# Patient Record
Sex: Male | Born: 1965 | Race: White | Hispanic: No | Smoking: Former smoker
Health system: Southern US, Community
[De-identification: ages and names within clinical notes are randomized; demographics above are authoritative.]

## PROBLEM LIST (undated history)

## (undated) DIAGNOSIS — D179 Benign lipomatous neoplasm, unspecified: Secondary | ICD-10-CM

## (undated) DIAGNOSIS — F419 Anxiety disorder, unspecified: Secondary | ICD-10-CM

## (undated) DIAGNOSIS — K219 Gastro-esophageal reflux disease without esophagitis: Secondary | ICD-10-CM

## (undated) DIAGNOSIS — I10 Essential (primary) hypertension: Secondary | ICD-10-CM

## (undated) HISTORY — PX: APPENDECTOMY: SHX54

## (undated) HISTORY — PX: CHOLECYSTECTOMY: SHX55

---

## 2001-08-21 HISTORY — PX: STRABISMUS SURGERY: SHX218

## 2008-08-21 HISTORY — PX: WISDOM TOOTH EXTRACTION: SHX21

## 2016-03-13 DIAGNOSIS — I10 Essential (primary) hypertension: Secondary | ICD-10-CM | POA: Diagnosis not present

## 2016-08-01 ENCOUNTER — Inpatient Hospital Stay (HOSPITAL_COMMUNITY)
Admission: EM | Admit: 2016-08-01 | Discharge: 2016-08-04 | DRG: 339 | Disposition: A | Discharge status: Home / Self Care | Payer: BLUE CROSS/BLUE SHIELD | Attending: General Surgery | Admitting: General Surgery

## 2016-08-01 ENCOUNTER — Observation Stay (HOSPITAL_COMMUNITY): Payer: BLUE CROSS/BLUE SHIELD | Admitting: Anesthesiology

## 2016-08-01 ENCOUNTER — Encounter (HOSPITAL_COMMUNITY): Admission: EM | Disposition: A | Discharge status: Home / Self Care | Payer: Self-pay | Source: Home / Self Care

## 2016-08-01 ENCOUNTER — Encounter (HOSPITAL_COMMUNITY): Payer: Self-pay | Admitting: *Deleted

## 2016-08-01 ENCOUNTER — Emergency Department (HOSPITAL_COMMUNITY): Payer: BLUE CROSS/BLUE SHIELD

## 2016-08-01 DIAGNOSIS — K353 Acute appendicitis with localized peritonitis: Secondary | ICD-10-CM | POA: Diagnosis not present

## 2016-08-01 DIAGNOSIS — K3580 Unspecified acute appendicitis: Secondary | ICD-10-CM | POA: Diagnosis not present

## 2016-08-01 DIAGNOSIS — I10 Essential (primary) hypertension: Secondary | ICD-10-CM | POA: Diagnosis present

## 2016-08-01 DIAGNOSIS — K36 Other appendicitis: Secondary | ICD-10-CM | POA: Diagnosis not present

## 2016-08-01 DIAGNOSIS — E669 Obesity, unspecified: Secondary | ICD-10-CM | POA: Diagnosis not present

## 2016-08-01 DIAGNOSIS — E871 Hypo-osmolality and hyponatremia: Secondary | ICD-10-CM | POA: Diagnosis present

## 2016-08-01 DIAGNOSIS — Z6835 Body mass index (BMI) 35.0-35.9, adult: Secondary | ICD-10-CM

## 2016-08-01 DIAGNOSIS — R1031 Right lower quadrant pain: Secondary | ICD-10-CM | POA: Diagnosis not present

## 2016-08-01 DIAGNOSIS — Z79899 Other long term (current) drug therapy: Secondary | ICD-10-CM | POA: Diagnosis not present

## 2016-08-01 DIAGNOSIS — K381 Appendicular concretions: Secondary | ICD-10-CM | POA: Diagnosis present

## 2016-08-01 DIAGNOSIS — F419 Anxiety disorder, unspecified: Secondary | ICD-10-CM | POA: Diagnosis not present

## 2016-08-01 DIAGNOSIS — K37 Unspecified appendicitis: Secondary | ICD-10-CM | POA: Diagnosis not present

## 2016-08-01 DIAGNOSIS — E878 Other disorders of electrolyte and fluid balance, not elsewhere classified: Secondary | ICD-10-CM | POA: Diagnosis present

## 2016-08-01 DIAGNOSIS — K651 Peritoneal abscess: Secondary | ICD-10-CM | POA: Diagnosis not present

## 2016-08-01 HISTORY — DX: Anxiety disorder, unspecified: F41.9

## 2016-08-01 HISTORY — DX: Gastro-esophageal reflux disease without esophagitis: K21.9

## 2016-08-01 HISTORY — PX: LAPAROSCOPIC APPENDECTOMY: SHX408

## 2016-08-01 HISTORY — DX: Essential (primary) hypertension: I10

## 2016-08-01 HISTORY — DX: Benign lipomatous neoplasm, unspecified: D17.9

## 2016-08-01 LAB — URINALYSIS, ROUTINE W REFLEX MICROSCOPIC
Bilirubin Urine: NEGATIVE
Glucose, UA: NEGATIVE mg/dL
HGB URINE DIPSTICK: NEGATIVE
Ketones, ur: NEGATIVE mg/dL
Leukocytes, UA: NEGATIVE
Nitrite: NEGATIVE
Protein, ur: NEGATIVE mg/dL
SPECIFIC GRAVITY, URINE: 1.014 (ref 1.005–1.030)
pH: 6 (ref 5.0–8.0)

## 2016-08-01 LAB — COMPREHENSIVE METABOLIC PANEL
ALT: 27 U/L (ref 17–63)
ANION GAP: 11 (ref 5–15)
AST: 22 U/L (ref 15–41)
Albumin: 4.3 g/dL (ref 3.5–5.0)
Alkaline Phosphatase: 79 U/L (ref 38–126)
BUN: 10 mg/dL (ref 6–20)
CALCIUM: 9.4 mg/dL (ref 8.9–10.3)
CHLORIDE: 97 mmol/L — AB (ref 101–111)
CO2: 24 mmol/L (ref 22–32)
Creatinine, Ser: 0.97 mg/dL (ref 0.61–1.24)
GFR calc non Af Amer: >60 mL/min (ref >60)
Glucose, Bld: 165 mg/dL — ABNORMAL HIGH (ref 65–99)
Potassium: 3.7 mmol/L (ref 3.5–5.1)
SODIUM: 132 mmol/L — AB (ref 135–145)
Total Bilirubin: 1.6 mg/dL — ABNORMAL HIGH (ref 0.3–1.2)
Total Protein: 7.7 g/dL (ref 6.5–8.1)

## 2016-08-01 LAB — CBC
HCT: 45.1 % (ref 39.0–52.0)
HEMOGLOBIN: 16.2 g/dL (ref 13.0–17.0)
MCH: 30.8 pg (ref 26.0–34.0)
MCHC: 35.9 g/dL (ref 30.0–36.0)
MCV: 85.7 fL (ref 78.0–100.0)
Platelets: 193 10*3/uL (ref 150–400)
RBC: 5.26 MIL/uL (ref 4.22–5.81)
RDW: 13 % (ref 11.5–15.5)
WBC: 14.7 10*3/uL — ABNORMAL HIGH (ref 4.0–10.5)

## 2016-08-01 LAB — LIPASE, BLOOD: LIPASE: 25 U/L (ref 11–51)

## 2016-08-01 SURGERY — APPENDECTOMY, LAPAROSCOPIC
Anesthesia: General | Site: Abdomen

## 2016-08-01 MED ORDER — HYDROMORPHONE HCL 2 MG/ML IJ SOLN
1.0000 mg | Freq: Once | INTRAMUSCULAR | Status: AC
Start: 2016-08-01 — End: 2016-08-01
  Administered 2016-08-01: 1 mg via INTRAVENOUS
  Filled 2016-08-01: qty 1

## 2016-08-01 MED ORDER — MIDAZOLAM HCL 2 MG/2ML IJ SOLN
INTRAMUSCULAR | Status: DC | PRN
  Administered 2016-08-01: 4 mg via INTRAVENOUS

## 2016-08-01 MED ORDER — METOPROLOL-HYDROCHLOROTHIAZIDE 100-25 MG PO TABS: 1.0000 | ORAL_TABLET | Freq: Every day | ORAL | Status: DC

## 2016-08-01 MED ORDER — 0.9 % SODIUM CHLORIDE (POUR BTL) OPTIME
TOPICAL | Status: DC | PRN
  Administered 2016-08-01: 300 mL

## 2016-08-01 MED ORDER — SUGAMMADEX SODIUM 200 MG/2ML IV SOLN
INTRAVENOUS | Status: AC
  Filled 2016-08-01: qty 2

## 2016-08-01 MED ORDER — SODIUM CHLORIDE 0.9 % IV BOLUS (SEPSIS)
1000.0000 mL | Freq: Once | INTRAVENOUS | Status: AC
  Administered 2016-08-01: 1000 mL via INTRAVENOUS

## 2016-08-01 MED ORDER — METOPROLOL TARTRATE 100 MG PO TABS: 100.0000 mg | ORAL_TABLET | Freq: Every day | ORAL | Status: DC

## 2016-08-01 MED ORDER — PIPERACILLIN-TAZOBACTAM 3.375 G IVPB 30 MIN
3.3750 g | Freq: Once | INTRAVENOUS | Status: AC
  Administered 2016-08-01: 3.375 g via INTRAVENOUS
  Filled 2016-08-01: qty 50

## 2016-08-01 MED ORDER — PHENYLEPHRINE 40 MCG/ML (10ML) SYRINGE FOR IV PUSH (FOR BLOOD PRESSURE SUPPORT)
PREFILLED_SYRINGE | INTRAVENOUS | Status: AC
  Filled 2016-08-01: qty 10

## 2016-08-01 MED ORDER — ONDANSETRON HCL 4 MG/2ML IJ SOLN
INTRAMUSCULAR | Status: AC
  Filled 2016-08-01: qty 10

## 2016-08-01 MED ORDER — PROPOFOL 10 MG/ML IV BOLUS
INTRAVENOUS | Status: DC | PRN
  Administered 2016-08-01: 200 mg via INTRAVENOUS

## 2016-08-01 MED ORDER — ONDANSETRON HCL 4 MG/2ML IJ SOLN
4.0000 mg | Freq: Once | INTRAMUSCULAR | Status: AC
  Administered 2016-08-01: 4 mg via INTRAVENOUS
  Filled 2016-08-01: qty 2

## 2016-08-01 MED ORDER — HYDROMORPHONE HCL 2 MG/ML IJ SOLN
2.0000 mg | INTRAMUSCULAR | Status: DC | PRN
  Administered 2016-08-01 – 2016-08-04 (×10): 2 mg via INTRAVENOUS
  Filled 2016-08-01 (×10): qty 1

## 2016-08-01 MED ORDER — LACTATED RINGERS IV SOLN
INTRAVENOUS | Status: DC
  Administered 2016-08-01 – 2016-08-03 (×6): via INTRAVENOUS

## 2016-08-01 MED ORDER — DIPHENHYDRAMINE HCL 25 MG PO CAPS: 25.0000 mg | ORAL_CAPSULE | Freq: Four times a day (QID) | ORAL | Status: DC | PRN

## 2016-08-01 MED ORDER — DEXAMETHASONE SODIUM PHOSPHATE 10 MG/ML IJ SOLN
INTRAMUSCULAR | Status: AC
  Filled 2016-08-01: qty 2

## 2016-08-01 MED ORDER — DEXAMETHASONE SODIUM PHOSPHATE 10 MG/ML IJ SOLN
INTRAMUSCULAR | Status: DC | PRN
  Administered 2016-08-01: 10 mg via INTRAVENOUS

## 2016-08-01 MED ORDER — ONDANSETRON HCL 4 MG/2ML IJ SOLN: 4.0000 mg | Freq: Four times a day (QID) | INTRAMUSCULAR | Status: DC | PRN

## 2016-08-01 MED ORDER — HYDROMORPHONE HCL 2 MG/ML IJ SOLN
1.0000 mg | Freq: Once | INTRAMUSCULAR | Status: AC
  Administered 2016-08-01: 1 mg via INTRAVENOUS
  Filled 2016-08-01: qty 1

## 2016-08-01 MED ORDER — SODIUM CHLORIDE 0.9 % IV SOLN
1.0000 g | Freq: Once | INTRAVENOUS | Status: AC
  Administered 2016-08-01: 1 g via INTRAVENOUS
  Filled 2016-08-01: qty 1

## 2016-08-01 MED ORDER — MIDAZOLAM HCL 2 MG/2ML IJ SOLN
INTRAMUSCULAR | Status: AC
  Filled 2016-08-01: qty 4

## 2016-08-01 MED ORDER — IOPAMIDOL (ISOVUE-300) INJECTION 61%
INTRAVENOUS | Status: AC
  Administered 2016-08-01: 100 mL
  Filled 2016-08-01: qty 100

## 2016-08-01 MED ORDER — DIPHENHYDRAMINE HCL 50 MG/ML IJ SOLN: 25.0000 mg | Freq: Four times a day (QID) | INTRAMUSCULAR | Status: DC | PRN

## 2016-08-01 MED ORDER — ONDANSETRON 4 MG PO TBDP: 4.0000 mg | ORAL_TABLET | Freq: Four times a day (QID) | ORAL | Status: DC | PRN

## 2016-08-01 MED ORDER — FENTANYL CITRATE (PF) 100 MCG/2ML IJ SOLN
INTRAMUSCULAR | Status: AC
  Filled 2016-08-01: qty 4

## 2016-08-01 MED ORDER — SODIUM CHLORIDE 0.9 % IV SOLN
1.0000 g | INTRAVENOUS | Status: DC
  Administered 2016-08-02 – 2016-08-03 (×2): 1 g via INTRAVENOUS
  Filled 2016-08-01 (×3): qty 1

## 2016-08-01 MED ORDER — OXYCODONE HCL 5 MG/5ML PO SOLN: 5.0000 mg | Freq: Once | ORAL | Status: DC | PRN

## 2016-08-01 MED ORDER — METHOCARBAMOL 1000 MG/10ML IJ SOLN
500.0000 mg | Freq: Three times a day (TID) | INTRAMUSCULAR | Status: DC
  Administered 2016-08-01 – 2016-08-04 (×8): 500 mg via INTRAVENOUS
  Filled 2016-08-01 (×12): qty 5

## 2016-08-01 MED ORDER — BUPIVACAINE-EPINEPHRINE 0.25% -1:200000 IJ SOLN
INTRAMUSCULAR | Status: DC | PRN
  Administered 2016-08-01: 16 mL

## 2016-08-01 MED ORDER — SODIUM CHLORIDE 0.9 % IV SOLN: INTRAVENOUS | Status: DC

## 2016-08-01 MED ORDER — BUPIVACAINE-EPINEPHRINE (PF) 0.25% -1:200000 IJ SOLN
INTRAMUSCULAR | Status: AC
  Filled 2016-08-01: qty 30

## 2016-08-01 MED ORDER — METOPROLOL TARTRATE 5 MG/5ML IV SOLN
3.0000 mg | Freq: Once | INTRAVENOUS | Status: AC
  Administered 2016-08-01: 3 mg via INTRAVENOUS

## 2016-08-01 MED ORDER — PIPERACILLIN-TAZOBACTAM 3.375 G IVPB: 3.3750 g | Freq: Three times a day (TID) | INTRAVENOUS | Status: DC

## 2016-08-01 MED ORDER — EPHEDRINE 5 MG/ML INJ
INTRAVENOUS | Status: AC
  Filled 2016-08-01: qty 10

## 2016-08-01 MED ORDER — PANTOPRAZOLE SODIUM 40 MG IV SOLR
40.0000 mg | Freq: Every day | INTRAVENOUS | Status: DC
  Administered 2016-08-01 – 2016-08-02 (×2): 40 mg via INTRAVENOUS
  Filled 2016-08-01 (×2): qty 40

## 2016-08-01 MED ORDER — OXYCODONE HCL 5 MG PO TABS: 5.0000 mg | ORAL_TABLET | Freq: Once | ORAL | Status: DC | PRN

## 2016-08-01 MED ORDER — SUGAMMADEX SODIUM 200 MG/2ML IV SOLN
INTRAVENOUS | Status: DC | PRN
  Administered 2016-08-01: 200 mg via INTRAVENOUS

## 2016-08-01 MED ORDER — ACETAMINOPHEN 325 MG PO TABS: 650.0000 mg | ORAL_TABLET | Freq: Four times a day (QID) | ORAL | Status: DC | PRN

## 2016-08-01 MED ORDER — HYDROCHLOROTHIAZIDE 25 MG PO TABS: 25.0000 mg | ORAL_TABLET | Freq: Every day | ORAL | Status: DC

## 2016-08-01 MED ORDER — LIDOCAINE 2% (20 MG/ML) 5 ML SYRINGE
INTRAMUSCULAR | Status: AC
  Filled 2016-08-01: qty 10

## 2016-08-01 MED ORDER — MORPHINE SULFATE (PF) 4 MG/ML IV SOLN: 4.0000 mg | INTRAVENOUS | Status: DC | PRN

## 2016-08-01 MED ORDER — ONDANSETRON HCL 4 MG/2ML IJ SOLN
INTRAMUSCULAR | Status: DC | PRN
  Administered 2016-08-01: 4 mg via INTRAVENOUS

## 2016-08-01 MED ORDER — ALPRAZOLAM 0.5 MG PO TABS
0.5000 mg | ORAL_TABLET | Freq: Two times a day (BID) | ORAL | Status: DC
  Administered 2016-08-01 – 2016-08-04 (×6): 0.5 mg via ORAL
  Filled 2016-08-01 (×6): qty 1

## 2016-08-01 MED ORDER — HYDROMORPHONE HCL 1 MG/ML IJ SOLN: 0.2500 mg | INTRAMUSCULAR | Status: DC | PRN

## 2016-08-01 MED ORDER — FENTANYL CITRATE (PF) 100 MCG/2ML IJ SOLN
INTRAMUSCULAR | Status: DC | PRN
  Administered 2016-08-01 (×4): 50 ug via INTRAVENOUS

## 2016-08-01 MED ORDER — LABETALOL HCL 5 MG/ML IV SOLN
INTRAVENOUS | Status: AC
  Filled 2016-08-01: qty 4

## 2016-08-01 MED ORDER — PROPOFOL 10 MG/ML IV BOLUS
INTRAVENOUS | Status: AC
  Filled 2016-08-01: qty 20

## 2016-08-01 MED ORDER — PHENOL 1.4 % MT LIQD
1.0000 | OROMUCOSAL | Status: DC | PRN
  Filled 2016-08-01: qty 177

## 2016-08-01 MED ORDER — LIDOCAINE 2% (20 MG/ML) 5 ML SYRINGE
INTRAMUSCULAR | Status: DC | PRN
  Administered 2016-08-01: 60 mg via INTRAVENOUS

## 2016-08-01 MED ORDER — PIPERACILLIN-TAZOBACTAM 3.375 G IVPB
3.3750 g | Freq: Three times a day (TID) | INTRAVENOUS | Status: DC
  Filled 2016-08-01 (×2): qty 50

## 2016-08-01 MED ORDER — SODIUM CHLORIDE 0.9 % IR SOLN
Status: DC | PRN
  Administered 2016-08-01: 3000 mL

## 2016-08-01 MED ORDER — LISINOPRIL 20 MG PO TABS
20.0000 mg | ORAL_TABLET | Freq: Every day | ORAL | Status: DC
  Administered 2016-08-01: 20 mg via ORAL
  Filled 2016-08-01: qty 1

## 2016-08-01 MED ORDER — SUCCINYLCHOLINE CHLORIDE 200 MG/10ML IV SOSY
PREFILLED_SYRINGE | INTRAVENOUS | Status: DC | PRN
  Administered 2016-08-01: 140 mg via INTRAVENOUS

## 2016-08-01 MED ORDER — LABETALOL HCL 5 MG/ML IV SOLN
INTRAVENOUS | Status: DC | PRN
  Administered 2016-08-01 (×2): 5 mg via INTRAVENOUS

## 2016-08-01 MED ORDER — METOPROLOL TARTRATE 5 MG/5ML IV SOLN
5.0000 mg | Freq: Four times a day (QID) | INTRAVENOUS | Status: DC
  Filled 2016-08-01 (×2): qty 5

## 2016-08-01 MED ORDER — ROCURONIUM BROMIDE 50 MG/5ML IV SOSY
PREFILLED_SYRINGE | INTRAVENOUS | Status: AC
  Filled 2016-08-01: qty 10

## 2016-08-01 MED ORDER — ROCURONIUM BROMIDE 100 MG/10ML IV SOLN
INTRAVENOUS | Status: DC | PRN
  Administered 2016-08-01: 50 mg via INTRAVENOUS

## 2016-08-01 SURGICAL SUPPLY — 37 items
CANISTER SUCTION 2500CC (MISCELLANEOUS) ×2 IMPLANT
CHLORAPREP W/TINT 26ML (MISCELLANEOUS) ×2 IMPLANT
CLSR STERI-STRIP ANTIMIC 1/2X4 (GAUZE/BANDAGES/DRESSINGS) ×2 IMPLANT
COVER SURGICAL LIGHT HANDLE (MISCELLANEOUS) ×2 IMPLANT
CUTTER FLEX LINEAR 45M (STAPLE) ×4 IMPLANT
DERMABOND ADVANCED (GAUZE/BANDAGES/DRESSINGS) ×1
DERMABOND ADVANCED .7 DNX12 (GAUZE/BANDAGES/DRESSINGS) ×1 IMPLANT
DRAIN CHANNEL 19F RND (DRAIN) ×2 IMPLANT
DRSG TEGADERM 2-3/8X2-3/4 SM (GAUZE/BANDAGES/DRESSINGS) ×6 IMPLANT
ELECT REM PT RETURN 9FT ADLT (ELECTROSURGICAL) ×2
ELECTRODE REM PT RTRN 9FT ADLT (ELECTROSURGICAL) ×1 IMPLANT
EVACUATOR SILICONE 100CC (DRAIN) ×2 IMPLANT
GLOVE BIOGEL PI IND STRL 8 (GLOVE) ×1 IMPLANT
GLOVE BIOGEL PI INDICATOR 8 (GLOVE) ×1
GLOVE ECLIPSE 7.5 STRL STRAW (GLOVE) ×2 IMPLANT
GOWN STRL REUS W/ TWL LRG LVL3 (GOWN DISPOSABLE) ×3 IMPLANT
GOWN STRL REUS W/TWL LRG LVL3 (GOWN DISPOSABLE) ×3
KIT BASIN OR (CUSTOM PROCEDURE TRAY) ×2 IMPLANT
KIT ROOM TURNOVER OR (KITS) ×2 IMPLANT
NS IRRIG 1000ML POUR BTL (IV SOLUTION) ×2 IMPLANT
PAD ARMBOARD 7.5X6 YLW CONV (MISCELLANEOUS) ×4 IMPLANT
POUCH SPECIMEN RETRIEVAL 10MM (ENDOMECHANICALS) ×4 IMPLANT
RELOAD STAPLE TA45 3.5 REG BLU (ENDOMECHANICALS) ×2 IMPLANT
SCALPEL HARMONIC ACE (MISCELLANEOUS) ×2 IMPLANT
SET IRRIG TUBING LAPAROSCOPIC (IRRIGATION / IRRIGATOR) ×2 IMPLANT
SLEEVE ENDOPATH XCEL 5M (ENDOMECHANICALS) ×2 IMPLANT
SPECIMEN JAR SMALL (MISCELLANEOUS) ×2 IMPLANT
STRIP CLOSURE SKIN 1/2X4 (GAUZE/BANDAGES/DRESSINGS) ×2 IMPLANT
SUT ETHILON 2 0 FS 18 (SUTURE) ×2 IMPLANT
SUT MNCRL AB 4-0 PS2 18 (SUTURE) ×2 IMPLANT
TOWEL OR 17X24 6PK STRL BLUE (TOWEL DISPOSABLE) ×2 IMPLANT
TOWEL OR 17X26 10 PK STRL BLUE (TOWEL DISPOSABLE) ×2 IMPLANT
TRAY FOLEY CATH 16FR SILVER (SET/KITS/TRAYS/PACK) ×2 IMPLANT
TRAY LAPAROSCOPIC MC (CUSTOM PROCEDURE TRAY) ×2 IMPLANT
TROCAR XCEL BLUNT TIP 100MML (ENDOMECHANICALS) ×2 IMPLANT
TROCAR XCEL NON-BLD 5MMX100MML (ENDOMECHANICALS) ×2 IMPLANT
TUBING INSUFFLATION (TUBING) ×2 IMPLANT

## 2016-08-01 NOTE — H&P (Signed)
[facility] Consult/Admission Note  Oscar Scott [birth year]  [id number].    Requesting MD: Dr. Ralene Bathe Chief Complaint/Reason for Consult: Abdominal pain, appendicitis  HPI:   Patient is a 50 year old male with a history of hypertension and anxiety who presented to the [facility] ED today with complaints of abdominal pain. Patient states 24 hours ago he began experiencing middle abdominal pain that was dull but later progressed to the right lower side and became sharp, constant, nonradiating, severe around 6 PM last evening. Associated sensation of fullness/bloating, tenesmus, nausea, chills, anorexia. Patient did not take anything for pain. Lying on his right side made the pain better. Patient denies vomiting, fevers, chest pain, shortness of breath, constipation, diarrhea, dysuria, hematuria, hematochezia or melena. Last meal was yesterday afternoon.   ED Course: Pt afebrile, leukocytosis 14.7, mildly elevated bilirubin, all other labs unremarkable CT abd/pel: Large appendicolith at the base of the appendix with significantly dilated appendix and surrounding inflammatory change compatible with acute appendicitis. Trace free fluid in the cul-de-sac.  ROS:  Review of Systems  Constitutional: Positive for chills. Negative for diaphoresis and fever.       Anorexia  HENT: Negative for sore throat.   Respiratory: Negative for cough and shortness of breath.   Cardiovascular: Negative for chest pain.  Gastrointestinal: Positive for abdominal pain and nausea. Negative for blood in stool, constipation, diarrhea, melena and vomiting.  Genitourinary: Negative for dysuria and hematuria.  Skin: Negative for rash.  Neurological: Negative for loss of consciousness and weakness.  All other systems reviewed and are negative.    History reviewed. No pertinent family history.  Past Medical History:  Diagnosis Date  . Hypertension     History reviewed. No pertinent surgical  history.  Social History:  reports that he has never smoked. He does not have any smokeless tobacco history on file. He reports that he drinks alcohol. He reports that he does not use drugs.  Allergies: No Known Allergies   (Not in a hospital admission)  Blood pressure 143/96, pulse 94, temperature 98.3 F (36.8 C), temperature source Oral, resp. rate 18, height '6\' 2"'$  (1.88 m), weight 265 lb (120.2 kg), SpO2 94 %.   Physical Exam: General: pleasant, WD/WN white male who is laying in bed in NAD HEENT: head is normocephalic, atraumatic.  Sclera are noninjected.   Oral mucosa is pink and dry Heart: regular, rate, and rhythm.  No obvious murmurs, gallops, or rubs noted.  2+ radial and DP pulses bilaterally Lungs: CTAB, no wheezes, rhonchi, or rales noted.  Respiratory effort nonlabored Abd: soft, nondistended, +BS, generalized TTP with greater TTP in the RLQ,  MS: all 4 extremities are symmetrical, full ROM, no edema Skin: warm and dry with no rashes noted Psych: A&Ox3 with an appropriate affect. Neuro: CM grossly 2-12 intact, normal speech  Results for orders placed or performed during the hospital encounter of 08/01/16 (from the past 48 hour(s))  Lipase, blood     Status: None   Collection Time: 08/01/16  9:31 AM  Result Value Ref Range   Lipase 25 11 - 51 U/L  Comprehensive metabolic panel     Status: Abnormal   Collection Time: 08/01/16  9:31 AM  Result Value Ref Range   Sodium 132 (L) 135 - 145 mmol/L   Potassium 3.7 3.5 - 5.1 mmol/L   Chloride 97 (L) 101 - 111 mmol/L   CO2 24 22 - 32 mmol/L   Glucose, Bld 165 (H) 65 - 99 mg/dL   BUN  10 6 - 20 mg/dL   Creatinine, Ser 0.97 0.61 - 1.24 mg/dL   Calcium 9.4 8.9 - 10.3 mg/dL   Total Protein 7.7 6.5 - 8.1 g/dL   Albumin 4.3 3.5 - 5.0 g/dL   AST 22 15 - 41 U/L   ALT 27 17 - 63 U/L   Alkaline Phosphatase 79 38 - 126 U/L   Total Bilirubin 1.6 (H) 0.3 - 1.2 mg/dL   GFR calc non Af Amer >60 >60 mL/min   GFR calc Af Amer >60 >60  mL/min    Comment: (NOTE) The eGFR has been calculated using the CKD EPI equation. This calculation has not been validated in all clinical situations. eGFR's persistently <60 mL/min signify possible Chronic Kidney Disease.    Anion gap 11 5 - 15  CBC     Status: Abnormal   Collection Time: 08/01/16  9:31 AM  Result Value Ref Range   WBC 14.7 (H) 4.0 - 10.5 K/uL   RBC 5.26 4.22 - 5.81 MIL/uL   Hemoglobin 16.2 13.0 - 17.0 g/dL   HCT 45.1 39.0 - 52.0 %   MCV 85.7 78.0 - 100.0 fL   MCH 30.8 26.0 - 34.0 pg   MCHC 35.9 30.0 - 36.0 g/dL   RDW 13.0 11.5 - 15.5 %   Platelets 193 150 - 400 K/uL  Urinalysis, Routine w reflex microscopic     Status: None   Collection Time: 08/01/16  1:20 PM  Result Value Ref Range   Color, Urine YELLOW YELLOW   APPearance CLEAR CLEAR   Specific Gravity, Urine 1.014 1.005 - 1.030   pH 6.0 5.0 - 8.0   Glucose, UA NEGATIVE NEGATIVE mg/dL   Hgb urine dipstick NEGATIVE NEGATIVE   Bilirubin Urine NEGATIVE NEGATIVE   Ketones, ur NEGATIVE NEGATIVE mg/dL   Protein, ur NEGATIVE NEGATIVE mg/dL   Nitrite NEGATIVE NEGATIVE   Leukocytes, UA NEGATIVE NEGATIVE   Ct Abdomen Pelvis W Contrast  Result Date: 08/01/2016 CLINICAL DATA:  Right lower quadrant pain, nausea. EXAM: CT ABDOMEN AND PELVIS WITH CONTRAST TECHNIQUE: Multidetector CT imaging of the abdomen and pelvis was performed using the standard protocol following bolus administration of intravenous contrast. CONTRAST:  100 ISOVUE-300 IOPAMIDOL (ISOVUE-300) INJECTION 61% COMPARISON:  None. FINDINGS: Lower chest: Lung bases are clear. No effusions. Heart is normal size. Hepatobiliary: Mild diffuse fatty infiltration of the liver. No focal abnormality or biliary duct dilatation. Gallbladder unremarkable. Pancreas: No focal abnormality or ductal dilatation. Spleen: No focal abnormality.  Normal size. Adrenals/Urinary Tract: No adrenal abnormality. No focal renal abnormality. No stones or hydronephrosis. Urinary bladder is  unremarkable. Stomach/Bowel: There is a large appendicolith at the base of the appendix. There is a retrocecal appendix which is dilated measuring 19 mm. Extensive surrounding inflammatory change. Findings compatible with acute appendicitis. Vascular/Lymphatic: Scattered aortic and iliac calcifications. No aneurysm. No adenopathy. Reproductive: No visible focal abnormality. Other:  Small amount of free fluid in the cul-de-sac.  No free air. Musculoskeletal: No acute bony abnormality or focal bone lesion. IMPRESSION: Large appendicolith at the base of the appendix with significantly dilated appendix and surrounding inflammatory change compatible with acute appendicitis. Trace free fluid in the cul-de-sac. These results will be called to the ordering clinician or representative by the Radiologist Assistant, and communication documented in the PACS or zVision Dashboard. Electronically Signed   By: Rolm Baptise M.D.   On: 08/01/2016 13:20      Assessment/Plan  Appendicitis: - pt will go to OR today for lap  appendectomy with Dr. Hulen Skains 12/12 - Zosyn, NPO, IVF  HTN: lopressor  FEN: NPO, IVF, protonix VTE: [facility] 08/01/2016, 2:35 PM Pager: [phone] Consults: [phone] Mon-Fri 7:00 am-4:30 pm Sat-Sun 7:00 am-11:30 am

## 2016-08-01 NOTE — Transfer of Care (Signed)
Immediate Anesthesia Transfer of Care Note  Patient: Oscar Scott  Procedure(s) Performed: Procedure(s): APPENDECTOMY LAPAROSCOPIC (N/A)  Patient Location: PACU  Anesthesia Type:General  Level of Consciousness: awake, alert  and oriented  Airway & Oxygen Therapy: Patient Spontanous Breathing and Patient connected to nasal cannula oxygen  Post-op Assessment: Report given to RN and Post -op Vital signs reviewed and stable  Post vital signs: Reviewed and stable  Last Vitals:  Vitals:   08/01/16 1635 08/01/16 1830  BP: 129/77 (!) 150/94  Pulse:  94  Resp:  17  Temp:  36.6 C    Last Pain:  Vitals:   08/01/16 1830  TempSrc:   PainSc: Asleep         Complications: No apparent anesthesia complications

## 2016-08-01 NOTE — Op Note (Signed)
OPERATIVE REPORT  DATE OF OPERATION: 08/01/2016  PATIENT:  Oscar Scott  50 y.o. male  PRE-OPERATIVE DIAGNOSIS:  ACUTE APPENDICITIS  POST-OPERATIVE DIAGNOSIS:  ACUTE GANGRENOUS APPENDICITIS WITH RUPTURE AND ABSCESS  INDICATION(S) FOR OPERATION:   CLINICAL ACUTE APPENDICITIS WITH CT CONFIRMATION  FINDINGS:  Pus in the right pericolic and pericecal area with a gangrenous appendix to the base of the cecum  PROCEDURE:  Procedure(s): APPENDECTOMY LAPAROSCOPIC  SURGEON:  Surgeon(s): Judeth Horn, MD  ASSISTANT: None  ANESTHESIA:   general  COMPLICATIONS:  None  EBL: 100 ml  BLOOD ADMINISTERED: none  DRAINS: Urinary Catheter (Foley) and (19 Fr.) Blake drain(s) in the right pericolic area   SPECIMEN:  Source of Specimen:  Appendix and right ileal antimesenteric fat  COUNTS CORRECT:  YES  PROCEDURE DETAILS: The patient was taken to the operating room and placed on table in supine position. After an adequate general endotracheal anesthetic was administered he was prepped and draped in usual sterile manner exposing his entire abdomen.  A proper timeout was performed identifying the patient and the procedure to be performed.  The initial incision was a supraumbilical midline incision made with a #15 blade and taken down to the midline fascia. We incised the fascia and then subsequently bluntly dissected down to the peritoneal cavity. A pursestring suture of 0 Vicryl was passed around the fascial opening. This secured in place a Hassan cannula which was used to insufflate the peritoneal cavity with carbon dioxide gas up to a maximal intra-abdominal pressure of 15 mmHg.  Once this was done a right upper quadrant 5 mm cannula and a left low quadrant 5 mm cannula pass under direct vision into the peritoneal cavity. Immediately upon passing the laparoscope pus could be seen in the right paracolic area. Further dissection demonstrated a gangrenous appendix which was in the right  retrocecal position. The terminal ileum was also tethered down to the ruptured appendix and had to be dissected away and rotated medially. The terminal ileal fat pad that is usually on antimesenteric border the TI was overlying the appendix also and had to be resected using a Harmonic Scalpel in order to gain access to the ruptured appendix.  The patient was placed in Trendelenburg position the left side was tilted down. We were able to dissect down to the base of the appendix and dissect out of the mesentery of the appendix which obliterated by the intense infection and no direct control of the mesenteric vessels was performed during the procedure. There was some bleeding but this appeared to be controlled at the end the case. During the attempted to come across the base of the appendix with a endoscopic GIA stapler, the appendix detached at the base of the point of necrosis. We were able to subsequently gain further control of the base of the appendix at the cecum and come across that using the GIA blue cartridge endoscopic stapler.  Once this was done we irrigated with 3 L of saline solution in the right lower quadrant finding there to be minimal to no bleeding. A fourth was passed in the right lower quadrant through which we were able to pull out the Ball drain in place and then right paracolic position. It was secured in place with a 2-0 nylon and laparoscopically was placed in the area of the abscess cavity and the ruptured appendix.  All counts were correct. The super umbilical fascia site was closed using the pursestring suture which was in place. The necrotic appendix was retrieved  from the abdominal cavity using an Endo Catch bag. 3 Endo Catch bags were used in order to retrieve the base of the cecum and the appendiceal stump and also the anti-mesenteric fat of the terminal ileum.  The patient was given intraoperative Invanz. All sponge counts and needle counts and instrument counts were  correct.  PATIENT DISPOSITION:  PACU - guarded condition.   Aubery Date 12/12/20176:25 PM

## 2016-08-01 NOTE — Anesthesia Procedure Notes (Addendum)
Procedure Name: Intubation Date/Time: 08/01/2016 4:55 PM Performed by: Trixie Deis A Pre-anesthesia Checklist: Patient identified, Emergency Drugs available, Suction available and Patient being monitored Patient Re-evaluated:Patient Re-evaluated prior to inductionOxygen Delivery Method: Circle System Utilized Preoxygenation: Pre-oxygenation with 100% oxygen Intubation Type: IV induction, Cricoid Pressure applied and Rapid sequence Laryngoscope Size: 4 and Mac Grade View: Grade I Tube type: Oral Tube size: 7.5 mm Number of attempts: 1 Airway Equipment and Method: Stylet and Oral airway Placement Confirmation: ETT inserted through vocal cords under direct vision,  positive ETCO2 and breath sounds checked- equal and bilateral Secured at: 23 cm Tube secured with: Tape Dental Injury: Teeth and Oropharynx as per pre-operative assessment

## 2016-08-01 NOTE — Anesthesia Preprocedure Evaluation (Addendum)
Anesthesia Evaluation  Patient identified by MRN, date of birth, ID band Patient awake    Reviewed: Allergy & Precautions, H&P , NPO status , Patient's Chart, lab work & pertinent test results  Airway Mallampati: II   Neck ROM: full    Dental  (+) Teeth Intact, Dental Advisory Given   Pulmonary neg pulmonary ROS,    breath sounds clear to auscultation       Cardiovascular hypertension,  Rhythm:regular Rate:Normal     Neuro/Psych    GI/Hepatic   Endo/Other  obese  Renal/GU      Musculoskeletal   Abdominal   Peds  Hematology   Anesthesia Other Findings   Reproductive/Obstetrics                            Anesthesia Physical Anesthesia Plan  ASA: II  Anesthesia Plan: General   Post-op Pain Management:    Induction: Intravenous  Airway Management Planned: Nasal ETT  Additional Equipment:   Intra-op Plan:   Post-operative Plan: Extubation in OR  Informed Consent: I have reviewed the patients History and Physical, chart, labs and discussed the procedure including the risks, benefits and alternatives for the proposed anesthesia with the patient or authorized representative who has indicated his/her understanding and acceptance.     Plan Discussed with: CRNA, Anesthesiologist and Surgeon  Anesthesia Plan Comments:         Anesthesia Quick Evaluation

## 2016-08-01 NOTE — ED Notes (Signed)
Pt unable to obtain urine sample at triage. 

## 2016-08-01 NOTE — ED Triage Notes (Signed)
Pt reports onset yesterday of LLQ pain with nausea. Denies vomiting, diarrhea or fever. Last BM was yesterday morning. Sent here for possible appendicitis.

## 2016-08-01 NOTE — ED Notes (Signed)
Report given to OR.

## 2016-08-01 NOTE — Anesthesia Postprocedure Evaluation (Signed)
Anesthesia Post Note  Patient: Oscar Scott  Procedure(s) Performed: Procedure(s) (LRB): APPENDECTOMY LAPAROSCOPIC (N/A)  Patient location during evaluation: PACU Anesthesia Type: General Level of consciousness: awake and alert Pain management: pain level controlled Vital Signs Assessment: post-procedure vital signs reviewed and stable Respiratory status: spontaneous breathing, nonlabored ventilation, respiratory function stable and patient connected to nasal cannula oxygen Cardiovascular status: blood pressure returned to baseline and stable Postop Assessment: no signs of nausea or vomiting Anesthetic complications: no    Last Vitals:  Vitals:   08/01/16 1840 08/01/16 1845  BP:    Pulse: 98 98  Resp: 15 14  Temp:      Last Pain:  Vitals:   08/01/16 1830  TempSrc:   PainSc: Asleep                 Catalina Gravel

## 2016-08-01 NOTE — ED Provider Notes (Signed)
[place] [facility] DEPT Provider Note   CSN: [id number] Arrival date & time: 08/01/16  0908     History   Chief Complaint Chief Complaint  Patient presents with  . Abdominal Pain    HPI Oscar Scott is a 50 y.o. male who presents with abdominal pain. PMH significant for HTN and anxiety. He states he developed some periumbilical pain yesterday afternoon. The pain has localized to the RLQ. It is sharp, stabbing, constant. He reports associated decreased PO intake, chills, nausea. Also having some difficulty with having a bowel movement and urinating. Last PO intake was water this morning. He states he has had an "appendix attack" when he was ~15 but it got better and has not had any issues since. He called his PCP who advised him to come to the ED. Denies fever, chest pain, SOB, vomiting, constipation/diarrhea.   HPI  Past Medical History:  Diagnosis Date  . Hypertension     There are no active problems to display for this patient.   History reviewed. No pertinent surgical history.     Home Medications    Prior to Admission medications   Not on File    Family History History reviewed. No pertinent family history.  Social History Social History  Substance Use Topics  . Smoking status: Never Smoker  . Smokeless tobacco: Not on file  . Alcohol use Yes     Comment: occ     Allergies   Patient has no known allergies.   Review of Systems Review of Systems  Constitutional: Positive for appetite change and chills. Negative for fever.  Respiratory: Negative for shortness of breath.   Cardiovascular: Negative for chest pain.  Gastrointestinal: Positive for abdominal pain and nausea. Negative for constipation, diarrhea and vomiting.  Genitourinary: Positive for difficulty urinating.  All other systems reviewed and are negative.    Physical Exam Updated Vital Signs BP 155/93 (BP Location: Left Arm)   Pulse 114   Temp 98.3 F (36.8 C) (Oral)   Resp 18   Ht  6\' 2"  (1.88 m)   Wt 120.2 kg   SpO2 98%   BMI 34.02 kg/m   Physical Exam  Constitutional: He is oriented to person, place, and time. He appears well-developed and well-nourished. No distress.  Obese male in NAD. Appears in pain.  HENT:  Head: Normocephalic and atraumatic.  Eyes: Conjunctivae are normal. Pupils are equal, round, and reactive to light. Right eye exhibits no discharge. Left eye exhibits no discharge. No scleral icterus.  Neck: Normal range of motion.  Cardiovascular: Regular rhythm.  Tachycardia present.  Exam reveals no gallop and no friction rub.   No murmur heard. Pulmonary/Chest: Effort normal and breath sounds normal. No respiratory distress. He has no wheezes. He has no rales. He exhibits no tenderness.  Abdominal: Soft. Bowel sounds are normal. He exhibits no distension and no mass. There is tenderness. There is guarding. There is no rebound. No hernia.  Significant RLQ tenderness with voluntary gaurding. Moderate RUQ, suprapubic, and LLQ tenderness.   Neurological: He is alert and oriented to person, place, and time.  Skin: Skin is warm and dry.  Psychiatric: His behavior is normal. His mood appears anxious.  Nursing note and vitals reviewed.    ED Treatments / Results  Labs (all labs ordered are listed, but only abnormal results are displayed) Labs Reviewed  COMPREHENSIVE METABOLIC PANEL - Abnormal; Notable for the following:       Result Value   Sodium 132 (*)  Chloride 97 (*)    Glucose, Bld 165 (*)    Total Bilirubin 1.6 (*)    All other components within normal limits  CBC - Abnormal; Notable for the following:    WBC 14.7 (*)    All other components within normal limits  LIPASE, BLOOD  URINALYSIS, ROUTINE W REFLEX MICROSCOPIC    EKG  EKG Interpretation None       Radiology No results found.  Procedures Procedures (including critical care time)  Medications Ordered in ED Medications  HYDROmorphone (DILAUDID) injection 1 mg (1 mg  Intravenous Given 08/01/16 1057)  ondansetron (ZOFRAN) injection 4 mg (4 mg Intravenous Given 08/01/16 1057)  sodium chloride 0.9 % bolus 1,000 mL (0 mLs Intravenous Stopped 08/01/16 1311)  iopamidol (ISOVUE-300) 61 % injection (100 mLs  Contrast Given 08/01/16 1150)  HYDROmorphone (DILAUDID) injection 1 mg (1 mg Intravenous Given 08/01/16 1316)  piperacillin-tazobactam (ZOSYN) IVPB 3.375 g (0 g Intravenous Stopped 08/01/16 1513)  metoprolol (LOPRESSOR) injection 3 mg (3 mg Intravenous Given 08/01/16 1625)  ertapenem (INVANZ) 1 g in sodium chloride 0.9 % 50 mL IVPB (1 g Intravenous New Bag/Given 08/01/16 1815)     Initial Impression / Assessment and Plan / ED Course  I have reviewed the triage vital signs and the nursing notes.  Pertinent labs & imaging results that were available during my care of the patient were reviewed by me and considered in my medical decision making (see chart for details).  Clinical Course    50 year old male with acute appendicitis. He is mildly hypertensive and tachycardic. He is afebrile. CBC remarkable for leukocytosis of 14.3. CMP remarkable for glucose 165 and mild hyponatremia and hypochloremia. UA clean. CT remarkable for appendicitis without evidence of abscess or perforation. Zosyn started. Fluids and pain medicines given. Shared visit with Dr. Ralene Bathe. Discussed with Jolayne Haines PA-C with surgery who will come to see patient. Appreciate assistance.  Final Clinical Impressions(s) / ED Diagnoses   Final diagnoses:  Acute appendicitis, unspecified acute appendicitis type    New Prescriptions New Prescriptions   No medications on file     Recardo Evangelist, PA-C 08/05/16 [place], MD 08/06/16 [phone]

## 2016-08-01 NOTE — Progress Notes (Signed)
Pt. Doesn't remember having EKG in the past, PCP Dr. Grayland Ormond.  Call to [facility], order rec'd to do EKG.

## 2016-08-02 ENCOUNTER — Encounter (HOSPITAL_COMMUNITY): Payer: Self-pay | Admitting: General Surgery

## 2016-08-02 DIAGNOSIS — E871 Hypo-osmolality and hyponatremia: Secondary | ICD-10-CM | POA: Diagnosis present

## 2016-08-02 DIAGNOSIS — R1031 Right lower quadrant pain: Secondary | ICD-10-CM | POA: Diagnosis present

## 2016-08-02 DIAGNOSIS — K353 Acute appendicitis with localized peritonitis: Secondary | ICD-10-CM | POA: Diagnosis present

## 2016-08-02 DIAGNOSIS — E669 Obesity, unspecified: Secondary | ICD-10-CM | POA: Diagnosis present

## 2016-08-02 DIAGNOSIS — Z6835 Body mass index (BMI) 35.0-35.9, adult: Secondary | ICD-10-CM | POA: Diagnosis not present

## 2016-08-02 DIAGNOSIS — F419 Anxiety disorder, unspecified: Secondary | ICD-10-CM | POA: Diagnosis present

## 2016-08-02 DIAGNOSIS — I10 Essential (primary) hypertension: Secondary | ICD-10-CM | POA: Diagnosis present

## 2016-08-02 DIAGNOSIS — E878 Other disorders of electrolyte and fluid balance, not elsewhere classified: Secondary | ICD-10-CM | POA: Diagnosis present

## 2016-08-02 DIAGNOSIS — K381 Appendicular concretions: Secondary | ICD-10-CM | POA: Diagnosis present

## 2016-08-02 DIAGNOSIS — Z79899 Other long term (current) drug therapy: Secondary | ICD-10-CM | POA: Diagnosis not present

## 2016-08-02 LAB — CBC
HEMATOCRIT: 42.7 % (ref 39.0–52.0)
Hemoglobin: 14.8 g/dL (ref 13.0–17.0)
MCH: 30.4 pg (ref 26.0–34.0)
MCHC: 34.7 g/dL (ref 30.0–36.0)
MCV: 87.7 fL (ref 78.0–100.0)
Platelets: 199 10*3/uL (ref 150–400)
RBC: 4.87 MIL/uL (ref 4.22–5.81)
RDW: 13.5 % (ref 11.5–15.5)
WBC: 15.3 10*3/uL — ABNORMAL HIGH (ref 4.0–10.5)

## 2016-08-02 LAB — BASIC METABOLIC PANEL
ANION GAP: 9 (ref 5–15)
BUN: 8 mg/dL (ref 6–20)
CALCIUM: 9 mg/dL (ref 8.9–10.3)
CHLORIDE: 101 mmol/L (ref 101–111)
CO2: 25 mmol/L (ref 22–32)
Creatinine, Ser: 0.96 mg/dL (ref 0.61–1.24)
GFR calc non Af Amer: >60 mL/min (ref >60)
GLUCOSE: 174 mg/dL — AB (ref 65–99)
Potassium: 4.1 mmol/L (ref 3.5–5.1)
Sodium: 135 mmol/L (ref 135–145)

## 2016-08-02 MED ORDER — METOPROLOL TARTRATE 50 MG PO TABS
50.0000 mg | ORAL_TABLET | Freq: Two times a day (BID) | ORAL | Status: DC
  Administered 2016-08-02 – 2016-08-04 (×5): 50 mg via ORAL
  Filled 2016-08-02 (×5): qty 1

## 2016-08-02 NOTE — Progress Notes (Signed)
1 Day Post-Op  Subjective: He looks pretty good.  No BS, drain is serosanguinous, but is a bit cloudy.  Not really complaining of pain, not hungry.  Sites [place].  Objective: Vital signs in last 24 hours: Temp:  [97.5 F (36.4 C)-98.6 F (37 C)] 98.3 F (36.8 C) (12/13 0500) Pulse Rate:  [90-114] 96 (12/13 0500) Resp:  [14-19] 18 (12/13 0500) BP: (124-155)/(74-96) 153/83 (12/13 0500) SpO2:  [92 %-98 %] 92 % (12/13 0500) Weight:  [120.2 kg (265 lb)-124.8 kg (275 lb 3.2 oz)] 124.8 kg (275 lb 3.2 oz) (12/12 1945) Last BM Date: 07/31/16 IV 2000 Urine 1525 Drain 80 Afebrile, VSS WBC still up, glucose up some   Intake/Output from previous day: 12/12 0701 - 12/13 0700 In: 2030.8 [I.V.:1805.8] Out: 1655 [Urine:1525; Drains:80; Blood:50] Intake/Output this shift: No intake/output data recorded.  General appearance: alert, cooperative and no distress Resp: clear to auscultation bilaterally GI: soft, sore, sites ok, no BS.  Lab Results:   Recent Labs  08/01/16 0931 08/02/16 0501  WBC 14.7* 15.3*  HGB 16.2 14.8  HCT 45.1 42.7  PLT 193 199    BMET  Recent Labs  08/01/16 0931 08/02/16 0501  NA 132* 135  K 3.7 4.1  CL 97* 101  CO2 24 25  GLUCOSE 165* 174*  BUN 10 8  CREATININE 0.97 0.96  CALCIUM 9.4 9.0   PT/INR No results for input(s): LABPROT, INR in the last 72 hours.   Recent Labs Lab 08/01/16 0931  AST 22  ALT 27  ALKPHOS 79  BILITOT 1.6*  PROT 7.7  ALBUMIN 4.3     Lipase     Component Value Date/Time   LIPASE 25 08/01/2016 0931     Studies/Results: Ct Abdomen Pelvis W Contrast  Result Date: 08/01/2016 CLINICAL DATA:  Right lower quadrant pain, nausea. EXAM: CT ABDOMEN AND PELVIS WITH CONTRAST TECHNIQUE: Multidetector CT imaging of the abdomen and pelvis was performed using the standard protocol following bolus administration of intravenous contrast. CONTRAST:  100 ISOVUE-300 IOPAMIDOL (ISOVUE-300) INJECTION 61% COMPARISON:  None. FINDINGS:  Lower chest: Lung bases are clear. No effusions. Heart is normal size. Hepatobiliary: Mild diffuse fatty infiltration of the liver. No focal abnormality or biliary duct dilatation. Gallbladder unremarkable. Pancreas: No focal abnormality or ductal dilatation. Spleen: No focal abnormality.  Normal size. Adrenals/Urinary Tract: No adrenal abnormality. No focal renal abnormality. No stones or hydronephrosis. Urinary bladder is unremarkable. Stomach/Bowel: There is a large appendicolith at the base of the appendix. There is a retrocecal appendix which is dilated measuring 19 mm. Extensive surrounding inflammatory change. Findings compatible with acute appendicitis. Vascular/Lymphatic: Scattered aortic and iliac calcifications. No aneurysm. No adenopathy. Reproductive: No visible focal abnormality. Other:  Small amount of free fluid in the cul-de-sac.  No free air. Musculoskeletal: No acute bony abnormality or focal bone lesion. IMPRESSION: Large appendicolith at the base of the appendix with significantly dilated appendix and surrounding inflammatory change compatible with acute appendicitis. Trace free fluid in the cul-de-sac. These results will be called to the ordering clinician or representative by the Radiologist Assistant, and communication documented in the PACS or zVision Dashboard. Electronically Signed   By: Rolm Baptise M.D.   On: 08/01/2016 13:20   Prior to Admission medications   Medication Sig Start Date End Date Taking? Authorizing Provider  ALPRAZolam Duanne Moron) 0.5 MG tablet Take 0.5 mg by mouth 2 (two) times daily. 07/19/16  Yes Historical Provider, MD  cetirizine (ZYRTEC) 10 MG tablet Take 10 mg by mouth  daily.   Yes Historical Provider, MD  famotidine (PEPCID) 10 MG tablet Take 10 mg by mouth 2 (two) times daily as needed for heartburn or indigestion.   Yes Historical Provider, MD  ibuprofen (ADVIL,MOTRIN) 200 MG tablet Take 400 mg by mouth every 6 (six) hours as needed.   Yes Historical  Provider, MD  lisinopril (PRINIVIL,ZESTRIL) 20 MG tablet Take 20 mg by mouth daily. 05/24/16  Yes Historical Provider, MD  metoprolol-hydrochlorothiazide (LOPRESSOR HCT) 100-25 MG tablet Take 1 tablet by mouth daily. 06/20/16  Yes Historical Provider, MD     Medications: . ALPRAZolam  0.5 mg Oral BID  . ertapenem  1 g Intravenous Q24H  . metoprolol tartrate  100 mg Oral Daily   And  . hydrochlorothiazide  25 mg Oral Daily  . lisinopril  20 mg Oral Daily  . methocarbamol (ROBAXIN)  IV  500 mg Intravenous Q8H  . pantoprazole (PROTONIX) IV  40 mg Intravenous QHS   . sodium chloride    . lactated ringers 50 mL/hr at 08/01/16 1557    Assessment/Plan Ruptured appendix,  S/p laparoscopic appendectomy, 08/01/16, Dr. Judeth Horn Hypertension -  Back on BB, giving with BID dosing instead of daily single dose.  will hold HCTZ and lisinopril till taking PO's well and BP up more. FEN:  IV fluids/NPO ID: day 2 Invanz DVT:  SCD add Lovenox this PM    Plan:  Ice chips and sips, hold Lisinopril and Hctz for now.  Increase IV fluids till bowel function improves.    LOS: 0 days    Shandale Malak 08/02/2016 [phone]

## 2016-08-03 ENCOUNTER — Encounter (HOSPITAL_COMMUNITY): Payer: Self-pay | Admitting: General Practice

## 2016-08-03 LAB — CBC
HCT: 39 % (ref 39.0–52.0)
HEMOGLOBIN: 12.9 g/dL — AB (ref 13.0–17.0)
MCH: 29.9 pg (ref 26.0–34.0)
MCHC: 33.1 g/dL (ref 30.0–36.0)
MCV: 90.5 fL (ref 78.0–100.0)
PLATELETS: 192 10*3/uL (ref 150–400)
RBC: 4.31 MIL/uL (ref 4.22–5.81)
RDW: 14 % (ref 11.5–15.5)
WBC: 11.4 10*3/uL — AB (ref 4.0–10.5)

## 2016-08-03 MED ORDER — PANTOPRAZOLE SODIUM 40 MG PO TBEC
40.0000 mg | DELAYED_RELEASE_TABLET | Freq: Every day | ORAL | Status: DC
  Administered 2016-08-03: 40 mg via ORAL
  Filled 2016-08-03: qty 1

## 2016-08-03 NOTE — Progress Notes (Signed)
[facility] Progress Note  2 Days Post-Op  Subjective: Pt states he is feeling much better. He was having dysuria but this has resolved. Still some abdominal pain, 5/10. No nausea, or vomiting. Pt is having flatus. Does not have an appetite. Drain output 25 overnight.  Objective: Vital signs in last 24 hours: Temp:  [97.8 F (36.6 C)-98.3 F (36.8 C)] 97.8 F (36.6 C) (12/14 0625) Pulse Rate:  [81-91] 87 (12/14 0625) Resp:  [16-18] 16 (12/14 0625) BP: (101-130)/(63-72) 130/72 (12/14 0625) SpO2:  [98 %-100 %] 98 % (12/14 0625) Last BM Date: 07/31/16  Intake/Output from previous day: 12/13 0701 - 12/14 0700 In: 2548.3 [P.O.:1280; I.V.:1168.3; IV Piggyback:100] Out: 833 [Urine:753; Drains:80] Intake/Output this shift: No intake/output data recorded.  PE: Gen:  Alert, NAD, pleasant, cooperative Card:  RRR, no M/G/R heard Abd: Soft, ND, mild TTP in the RLQ, +BS, incisions C/D/I, drain with minimal sanguinous drainage Skin: no rashes noted, warm and dry  Lab Results:   Recent Labs  08/01/16 0931 08/02/16 0501  WBC 14.7* 15.3*  HGB 16.2 14.8  HCT 45.1 42.7  PLT 193 199   BMET  Recent Labs  08/01/16 0931 08/02/16 0501  NA 132* 135  K 3.7 4.1  CL 97* 101  CO2 24 25  GLUCOSE 165* 174*  BUN 10 8  CREATININE 0.97 0.96  CALCIUM 9.4 9.0   PT/INR No results for input(s): LABPROT, INR in the last 72 hours. CMP     Component Value Date/Time   NA 135 08/02/2016 0501   K 4.1 08/02/2016 0501   CL 101 08/02/2016 0501   CO2 25 08/02/2016 0501   GLUCOSE 174 (H) 08/02/2016 0501   BUN 8 08/02/2016 0501   CREATININE 0.96 08/02/2016 0501   CALCIUM 9.0 08/02/2016 0501   PROT 7.7 08/01/2016 0931   ALBUMIN 4.3 08/01/2016 0931   AST 22 08/01/2016 0931   ALT 27 08/01/2016 0931   ALKPHOS 79 08/01/2016 0931   BILITOT 1.6 (H) 08/01/2016 0931   GFRNONAA >60 08/02/2016 0501   GFRAA >60 08/02/2016 0501   Lipase     Component Value Date/Time   LIPASE 25  08/01/2016 0931       Studies/Results: Ct Abdomen Pelvis W Contrast  Result Date: 08/01/2016 CLINICAL DATA:  Right lower quadrant pain, nausea. EXAM: CT ABDOMEN AND PELVIS WITH CONTRAST TECHNIQUE: Multidetector CT imaging of the abdomen and pelvis was performed using the standard protocol following bolus administration of intravenous contrast. CONTRAST:  100 ISOVUE-300 IOPAMIDOL (ISOVUE-300) INJECTION 61% COMPARISON:  None. FINDINGS: Lower chest: Lung bases are clear. No effusions. Heart is normal size. Hepatobiliary: Mild diffuse fatty infiltration of the liver. No focal abnormality or biliary duct dilatation. Gallbladder unremarkable. Pancreas: No focal abnormality or ductal dilatation. Spleen: No focal abnormality.  Normal size. Adrenals/Urinary Tract: No adrenal abnormality. No focal renal abnormality. No stones or hydronephrosis. Urinary bladder is unremarkable. Stomach/Bowel: There is a large appendicolith at the base of the appendix. There is a retrocecal appendix which is dilated measuring 19 mm. Extensive surrounding inflammatory change. Findings compatible with acute appendicitis. Vascular/Lymphatic: Scattered aortic and iliac calcifications. No aneurysm. No adenopathy. Reproductive: No visible focal abnormality. Other:  Small amount of free fluid in the cul-de-sac.  No free air. Musculoskeletal: No acute bony abnormality or focal bone lesion. IMPRESSION: Large appendicolith at the base of the appendix with significantly dilated appendix and surrounding inflammatory change compatible with acute appendicitis. Trace free fluid in the cul-de-sac. These results will be called  to the ordering clinician or representative by the Radiologist Assistant, and communication documented in the PACS or zVision Dashboard. Electronically Signed   By: Rolm Baptise M.D.   On: 08/01/2016 13:20    Anti-infectives: Anti-infectives    Start     Dose/Rate Route Frequency Ordered Stop   08/02/16 1815  ertapenem  (INVANZ) 1 g in sodium chloride 0.9 % 50 mL IVPB     1 g 100 mL/hr over 30 Minutes Intravenous Every 24 hours 08/01/16 1943     08/01/16 2000  piperacillin-tazobactam (ZOSYN) IVPB 3.375 g  Status:  Discontinued     3.375 g 12.5 mL/hr over 240 Minutes Intravenous Every 8 hours 08/01/16 1443 08/01/16 1943   08/01/16 1815  ertapenem (INVANZ) 1 g in sodium chloride 0.9 % 50 mL IVPB     1 g 100 mL/hr over 30 Minutes Intravenous  Once 08/01/16 1808 08/01/16 1815   08/01/16 1445  piperacillin-tazobactam (ZOSYN) IVPB 3.375 g  Status:  Discontinued     3.375 g 12.5 mL/hr over 240 Minutes Intravenous Every 8 hours 08/01/16 1442 08/01/16 1443   08/01/16 1330  piperacillin-tazobactam (ZOSYN) IVPB 3.375 g     3.375 g 100 mL/hr over 30 Minutes Intravenous  Once 08/01/16 1328 08/01/16 1513       Assessment/Plan  Ruptured appendix  S/p laparoscopic appendectomy, 08/01/16, Dr. Judeth Horn  Hypertension -  Back on BB, giving with BID dosing instead of daily single dose.  will hold HCTZ and lisinopril till taking PO's well and BP up more.  FEN:  IV fluids/NPO DVT:  SCD add Lovenox this PM  ID: Zosyn 12/12 for one dose, Invanz 12/12>>   Plan:  Will advance to fulls. Pt is having flatus and +BS. He tolerated the clears well without nausea or vomiting. Pt states his appetite has not returned. Will keep on IV fluids until PO intake improves. Hold Lisinopril and Hctz for now. WBC slightly up yesterday but down today.   LOS: 1 day    Kalman Drape , [facility] Surgery 08/03/2016, 7:37 AM Pager: [phone] Consults: [phone] Mon-Fri 7:00 am-4:30 pm Sat-Sun 7:00 am-11:30 am

## 2016-08-04 LAB — CBC
HCT: 37.8 % — ABNORMAL LOW (ref 39.0–52.0)
HEMOGLOBIN: 12.6 g/dL — AB (ref 13.0–17.0)
MCH: 29.8 pg (ref 26.0–34.0)
MCHC: 33.3 g/dL (ref 30.0–36.0)
MCV: 89.4 fL (ref 78.0–100.0)
Platelets: 203 10*3/uL (ref 150–400)
RBC: 4.23 MIL/uL (ref 4.22–5.81)
RDW: 13.5 % (ref 11.5–15.5)
WBC: 7.8 10*3/uL (ref 4.0–10.5)

## 2016-08-04 LAB — BASIC METABOLIC PANEL
Anion gap: 9 (ref 5–15)
BUN: 12 mg/dL (ref 6–20)
CHLORIDE: 100 mmol/L — AB (ref 101–111)
CO2: 27 mmol/L (ref 22–32)
CREATININE: 0.95 mg/dL (ref 0.61–1.24)
Calcium: 8.9 mg/dL (ref 8.9–10.3)
Glucose, Bld: 136 mg/dL — ABNORMAL HIGH (ref 65–99)
POTASSIUM: 3.8 mmol/L (ref 3.5–5.1)
Sodium: 136 mmol/L (ref 135–145)

## 2016-08-04 MED ORDER — HYDROMORPHONE HCL 2 MG/ML IJ SOLN: 1.0000 mg | INTRAMUSCULAR | Status: DC | PRN

## 2016-08-04 MED ORDER — AMOXICILLIN-POT CLAVULANATE 875-125 MG PO TABS: 1.0000 | ORAL_TABLET | Freq: Two times a day (BID) | ORAL | 0 refills | Status: AC

## 2016-08-04 MED ORDER — OXYCODONE HCL 5 MG PO TABS: 5.0000 mg | ORAL_TABLET | ORAL | 0 refills | Status: AC | PRN

## 2016-08-04 MED ORDER — OXYCODONE HCL 5 MG PO TABS: 5.0000 mg | ORAL_TABLET | ORAL | 0 refills | Status: DC | PRN

## 2016-08-04 MED ORDER — OXYCODONE HCL 5 MG PO TABS: 5.0000 mg | ORAL_TABLET | ORAL | Status: DC | PRN

## 2016-08-04 NOTE — Discharge Summary (Signed)
[facility] Discharge Summary   Patient ID: Oscar Scott MRN: [id number] DOB/AGE: 05/11/1966 50 y.o.  Admit date: 08/01/2016 Discharge date: 08/04/2016  Admitting Diagnosis: Appendicitis  HTN   Discharge Diagnosis Patient Active Problem List   Diagnosis Date Noted  . Appendicitis 08/01/2016    Consultants None   Procedures Dr. Judeth Horn (08/01/16) - Laparoscopic Appendectomy with placement of [facility] drain  Hospital Course:  Oscar Scott is a 50 year old male with a history of hypertension and anxiety  who presented to [facility] with abdominal pain.  Workup showed appendicitis.  Patient was admitted and underwent procedure listed above.  Tolerated procedure well and was transferred to the floor on IV antibiotics.  Diet was advanced as tolerated.  On POD3, the patient was voiding well, tolerating diet, ambulating well, pain well controlled, vital signs stable, incisions c/d/i, drain with minimal drainage and felt stable for discharge home.  He has been instructed to take Augmentin as prescribed. Patient will follow up in our office in next week to see Dr. Hulen Skains and knows to call with questions or concerns.  He will call to confirm appointment date/time.    Condition on discharge:  Improved  Allergies as of 08/04/2016   No Known Allergies     Medication List    TAKE these medications   ALPRAZolam 0.5 MG tablet Commonly known as:  XANAX Take 0.5 mg by mouth 2 (two) times daily.   amoxicillin-clavulanate 875-125 MG tablet Commonly known as:  AUGMENTIN Take 1 tablet by mouth every 12 (twelve) hours.   cetirizine 10 MG tablet Commonly known as:  ZYRTEC Take 10 mg by mouth daily.   famotidine 10 MG tablet Commonly known as:  PEPCID Take 10 mg by mouth 2 (two) times daily as needed for heartburn or indigestion.   ibuprofen 200 MG tablet Commonly known as:  ADVIL,MOTRIN Take 400 mg by mouth every 6 (six) hours as needed.   lisinopril 20 MG  tablet Commonly known as:  PRINIVIL,ZESTRIL Take 20 mg by mouth daily.   metoprolol-hydrochlorothiazide 100-25 MG tablet Commonly known as:  LOPRESSOR HCT Take 1 tablet by mouth daily.   oxyCODONE 5 MG immediate release tablet Commonly known as:  Oxy IR/ROXICODONE Take 1 tablet (5 mg total) by mouth every 4 (four) hours as needed for moderate pain.        Follow-up Information    Judeth Horn, MD. Go on 08/08/2016.   Specialty:  General Surgery Why:  appointment at 8:50 arrive at 8:20 to complete paperwork Contact information: [address] [place] [address] [phone]           Signed: [facility] 08/04/2016, 3:24 PM Pager: [phone] Consults: [phone] Mon-Fri 7:00 am-4:30 pm Sat-Sun 7:00 am-11:30 am

## 2016-08-04 NOTE — Progress Notes (Signed)
Pt discharged to home.  Pt discharged with JP to RLQ.  Discharge instructions explained to pt.  Explained to pt the process of emptying JP drain and documenting the amount emptied.  Pt states he understands and has no questions at discharge.  Pt states he has all belongings.  IV removed.  Pt ambulated off unit on his own.

## 2016-08-04 NOTE — Progress Notes (Signed)
[facility] Progress Note  3 Days Post-Op  Subjective: Pt feeling great. Mild abdominal pain. Passing gas but no BM. No nausea or vomiting. Pt has been walking the halls with ease.   Objective: Vital signs in last 24 hours: Temp:  [97.7 F (36.5 C)-98 F (36.7 C)] 98 F (36.7 C) (12/15 0553) Pulse Rate:  [85-98] 98 (12/15 0553) Resp:  [17-18] 17 (12/15 0553) BP: (128-147)/(65-81) 147/81 (12/15 0553) SpO2:  [95 %] 95 % (12/15 0553) Last BM Date: 07/31/16  Intake/Output from previous day: 12/14 0701 - 12/15 0700 In: 2912.3 [P.O.:1719; I.V.:1088.3; IV Piggyback:105] Out: 1790 [id number]; Drains:15] Intake/Output this shift: No intake/output data recorded.  PE: Gen:  Alert, NAD, pleasant, cooperative, sitting up in chair Card:  RRR, no M/G/R heard Abd: Soft, ND, mild TTP in the RLQ, +BS, incisions C/D/I, drain with scant brownish sanguinous drainage Skin: no rashes noted, warm and dry  Lab Results:   Recent Labs  08/03/16 0748 08/04/16 0619  WBC 11.4* 7.8  HGB 12.9* 12.6*  HCT 39.0 37.8*  PLT 192 203   BMET  Recent Labs  08/02/16 0501 08/04/16 0619  NA 135 136  K 4.1 3.8  CL 101 100*  CO2 25 27  GLUCOSE 174* 136*  BUN 8 12  CREATININE 0.96 0.95  CALCIUM 9.0 8.9   PT/INR No results for input(s): LABPROT, INR in the last 72 hours. CMP     Component Value Date/Time   NA 136 08/04/2016 0619   K 3.8 08/04/2016 0619   CL 100 (L) 08/04/2016 0619   CO2 27 08/04/2016 0619   GLUCOSE 136 (H) 08/04/2016 0619   BUN 12 08/04/2016 0619   CREATININE 0.95 08/04/2016 0619   CALCIUM 8.9 08/04/2016 0619   PROT 7.7 08/01/2016 0931   ALBUMIN 4.3 08/01/2016 0931   AST 22 08/01/2016 0931   ALT 27 08/01/2016 0931   ALKPHOS 79 08/01/2016 0931   BILITOT 1.6 (H) 08/01/2016 0931   GFRNONAA >60 08/04/2016 0619   GFRAA >60 08/04/2016 0619   Lipase     Component Value Date/Time   LIPASE 25 08/01/2016 0931       Studies/Results: No results  found.  Anti-infectives: Anti-infectives    Start     Dose/Rate Route Frequency Ordered Stop   08/02/16 1815  ertapenem (INVANZ) 1 g in sodium chloride 0.9 % 50 mL IVPB     1 g 100 mL/hr over 30 Minutes Intravenous Every 24 hours 08/01/16 1943     08/01/16 2000  piperacillin-tazobactam (ZOSYN) IVPB 3.375 g  Status:  Discontinued     3.375 g 12.5 mL/hr over 240 Minutes Intravenous Every 8 hours 08/01/16 1443 08/01/16 1943   08/01/16 1815  ertapenem (INVANZ) 1 g in sodium chloride 0.9 % 50 mL IVPB     1 g 100 mL/hr over 30 Minutes Intravenous  Once 08/01/16 1808 08/01/16 1815   08/01/16 1445  piperacillin-tazobactam (ZOSYN) IVPB 3.375 g  Status:  Discontinued     3.375 g 12.5 mL/hr over 240 Minutes Intravenous Every 8 hours 08/01/16 1442 08/01/16 1443   08/01/16 1330  piperacillin-tazobactam (ZOSYN) IVPB 3.375 g     3.375 g 100 mL/hr over 30 Minutes Intravenous  Once 08/01/16 1328 08/01/16 1513       Assessment/Plan  Ruptured appendix  S/p laparoscopic appendectomy, 08/01/16, Dr. Judeth Horn  Hypertension - Back on BB, giving with BID dosing instead of daily single dose. will hold HCTZ and lisinopril.  FEN: soft diet DVT:  SCD  ID: Zosyn 12/12 for one dose, Invanz 12/12>>  Plan: Will advance to soft diet. Pt is having flatus and +BS. He tolerated the fulls well without nausea or vomiting. Hold Lisinopril and Hctz for now. WBC normal. If he tolerates soft diet today will d/c pt to home this afternoon.   LOS: 2 days    Kalman Drape , [facility] Surgery 08/04/2016, 7:39 AM Pager: [phone] Consults: [phone] Mon-Fri 7:00 am-4:30 pm Sat-Sun 7:00 am-11:30 am

## 2016-08-04 NOTE — Discharge Instructions (Signed)
Your appointment is at (see above), please arrive at least 30 min before your appointment to complete your check in paperwork.  If you are unable to arrive 30 min prior to your appointment time we may have to cancel or reschedule you.  LAPAROSCOPIC SURGERY: POST OP INSTRUCTIONS  1. DIET: Follow a light bland diet the first 24 hours after arrival home, such as soup, liquids, crackers, etc. Be sure to include lots of fluids daily. Avoid fast food or heavy meals as your are more likely to get nauseated. Eat a low fat the next few days after surgery.  2. Take your usually prescribed home medications unless otherwise directed. 3. PAIN CONTROL:  1. Pain is best controlled by a usual combination of three different methods TOGETHER:  1. Ice/Heat 2. Over the counter pain medication 3. Prescription pain medication 2. Most patients will experience some swelling and bruising around the incisions. Ice packs or heating pads (30-60 minutes up to 6 times a day) will help. Use ice for the first few days to help decrease swelling and bruising, then switch to heat to help relax tight/sore spots and speed recovery. Some people prefer to use ice alone, heat alone, alternating between ice & heat. Experiment to what works for you. Swelling and bruising can take several weeks to resolve.  3. It is helpful to take an over-the-counter pain medication regularly for the first few weeks. Choose one of the following that works best for you:  1. Naproxen (Aleve, etc) Two 220mg  tabs twice a day 2. Ibuprofen (Advil, etc) Three 200mg  tabs four times a day (every meal & bedtime) 3. Acetaminophen (Tylenol, etc) 500-650mg  four times a day (every meal & bedtime) 4. A prescription for pain medication (such as oxycodone, hydrocodone, etc) should be given to you upon discharge. Take your pain medication as prescribed.  1. If you are having problems/concerns with the prescription medicine (does not control pain, nausea, vomiting, rash,  itching, etc), please call us [phone] to see if we need to switch you to a different pain medicine that will work better for you and/or control your side effect better. 2. If you need a refill on your pain medication, please contact your pharmacy. They will contact our office to request authorization. Prescriptions will not be filled after 5 pm or on week-ends. 4. Avoid getting constipated. Between the surgery and the pain medications, it is common to experience some constipation. Increasing fluid intake and taking a fiber supplement (such as Metamucil, Citrucel, FiberCon, MiraLax, etc) 1-2 times a day regularly will usually help prevent this problem from occurring. A mild laxative (prune juice, Milk of Magnesia, MiraLax, etc) should be taken according to package directions if there are no bowel movements after 48 hours.  5. Watch out for diarrhea. If you have many loose bowel movements, simplify your diet to bland foods & liquids for a few days. Stop any stool softeners and decrease your fiber supplement. Switching to mild anti-diarrheal medications (Kayopectate, Pepto Bismol) can help. If this worsens or does not improve, please call us. 6. Wash / shower every day. You may shower over the dressings as they are waterproof. Continue to shower over incision(s) after the dressing is off. 7. Remove your waterproof bandages 5 days after surgery. You may leave the incision open to air. You may replace a dressing/Band-Aid to cover the incision for comfort if you wish.  8. ACTIVITIES as tolerated:  1. You may resume regular (light) daily activities beginning the next day--such  as daily self-care, walking, climbing stairs--gradually increasing activities as tolerated. If you can walk 30 minutes without difficulty, it is safe to try more intense activity such as jogging, treadmill, bicycling, low-impact aerobics, swimming, etc. 2. Save the most intensive and strenuous activity for last such as sit-ups, heavy  lifting, contact sports, etc Refrain from any heavy lifting or straining until you are off narcotics for pain control.  3. DO NOT PUSH THROUGH PAIN. Let pain be your guide: If it hurts to do something, don't do it. Pain is your body warning you to avoid that activity for another week until the pain goes down. 4. You may drive when you are no longer taking prescription pain medication, you can comfortably wear a seatbelt, and you can safely maneuver your car and apply brakes. 5. You may have sexual intercourse when it is comfortable.  9. FOLLOW UP in our office  1. Please call [facility] at [phone] to set up an appointment to see your surgeon in the office for a follow-up appointment approximately 2-3 weeks after your surgery. 2. Make sure that you call for this appointment the day you arrive home to insure a convenient appointment time.      10. IF YOU HAVE DISABILITY OR FAMILY LEAVE FORMS, BRING THEM TO THE               OFFICE FOR PROCESSING.   WHEN TO CALL us [phone]:  1. Poor pain control 2. Reactions / problems with new medications (rash/itching, nausea, etc)  3. Fever over 101.5 F (38.5 C) 4. Inability to urinate 5. Nausea and/or vomiting 6. Worsening swelling or bruising 7. Continued bleeding from incision. 8. Increased pain, redness, or drainage from the incision  The clinic staff is available to answer your questions during regular business hours (8:30am-5pm). Please dont hesitate to call and ask to speak to one of our nurses for clinical concerns.  If you have a medical emergency, go to the nearest emergency room or call 911.  A surgeon from [facility] is always on call at the [facility], [place], [place], [place], [place] [address] ?  MAIN: [phone] ? TOLL FREE: [phone] ?  FAX [phone]  [email or web address]   [facility] Introduction Surgical drains are used to remove  extra fluid that normally builds up in a surgical wound after surgery. A surgical drain helps to heal a surgical wound. Different kinds of surgical drains include:  Active drains. These drains use suction to pull drainage away from the surgical wound. Drainage flows through a tube to a container outside of the body. It is important to keep the bulb or the drainage container flat (compressed) at all times, except while you empty it. Flattening the bulb or container creates suction. The two most common types of active drains are bulb drains and Hemovac drains.  Passive drains. These drains allow fluid to drain naturally, by gravity. Drainage flows through a tube to a bandage (dressing) or a container outside of the body. Passive drains do not need to be emptied. The most common type of passive drain is the Penrose drain. A drain is placed during surgery. Immediately after surgery, drainage is usually bright red and a little thicker than water. The drainage may gradually turn yellow or pink and become thinner. It is likely that your health care provider will remove the drain when the drainage stops or when the amount decreases  to 1-2 Tbsp (15-30 mL) during a 24-hour period. How to care for your surgical drain  Keep the skin around the drain dry and covered with a dressing at all times.  Check your drain area every day for signs of infection. Check for:  More redness, swelling, or pain.  Pus or a bad smell.  Cloudy drainage. Follow instructions from your health care provider about how to take care of your drain and how to change your dressing. Change your dressing at least one time every day. Change it more often if needed to keep the dressing dry. Make sure you: 1. Gather your supplies, including:  Tape.  Germ-free cleaning solution (sterile saline).  Split gauze drain sponge: 4 x 4 inches (10 x 10 cm).  Gauze square: 4 x 4 inches (10 x 10 cm). 2. Wash your hands with soap and water before  you change your dressing. If soap and water are not available, use hand sanitizer. 3. Remove the old dressing. Avoid using scissors to do that. 4. Use sterile saline to clean your skin around the drain. 5. Place the tube through the slit in a drain sponge. Place the drain sponge so that it covers your wound. 6. Place the gauze square or another drain sponge on top of the drain sponge that is on the wound. Make sure the tube is between those layers. 7. Tape the dressing to your skin. 8. If you have an active bulb or Hemovac drain, tape the drainage tube to your skin 1-2 inches (2.5-5 cm) below the place where the tube enters your body. Taping keeps the tube from pulling on any stitches (sutures) that you have. 9. Wash your hands with soap and water. 10. Write down the color of your drainage and how often you change your dressing. How to empty your active bulb or Hemovac drain 1. Make sure that you have a measuring cup that you can empty your drainage into. 2. Wash your hands with soap and water. If soap and water are not available, use hand sanitizer. 3. Gently move your fingers down the tube while squeezing very lightly. This is called stripping the tube. This clears any drainage, clots, or tissue from the tube.  Do not pull on the tube.  You may need to strip the tube several times every day to keep the tube clear. 4. Open the bulb cap or the drain plug. Do not touch the inside of the cap or the bottom of the plug. 5. Empty all of the drainage into the measuring cup. 6. Compress the bulb or the container and replace the cap or the plug. To compress the bulb or the container, squeeze it firmly in the middle while you close the cap or plug the container. 7. Write down the amount of drainage that you have in each 24-hour period. If you have less than 2 Tbsp (30 mL) of drainage during 24 hours, contact your health care provider. 8. Flush the drainage down the toilet. 9. Wash your hands with soap  and water. Contact a health care provider if:  You have more redness, swelling, or pain around your drain area.  The amount of drainage that you have is increasing instead of decreasing.  You have pus or a bad smell coming from your drain area.  You have a fever.  You have drainage that is cloudy.  There is a sudden stop or a sudden decrease in the amount of drainage that you have.  Your tube falls  out.  Your active draindoes not stay compressedafter you empty it. This information is not intended to replace advice given to you by your health care provider. Make sure you discuss any questions you have with your health care provider. Document Released: 08/04/2000 Document Revised: 01/13/2016 Document Reviewed: 02/24/2015  2017 [employer]vier

## 2016-08-10 DIAGNOSIS — I10 Essential (primary) hypertension: Secondary | ICD-10-CM | POA: Diagnosis not present

## 2016-08-10 DIAGNOSIS — K36 Other appendicitis: Secondary | ICD-10-CM | POA: Diagnosis not present

## 2016-08-10 DIAGNOSIS — Z23 Encounter for immunization: Secondary | ICD-10-CM | POA: Diagnosis not present

## 2016-11-10 DIAGNOSIS — Z0000 Encounter for general adult medical examination without abnormal findings: Secondary | ICD-10-CM | POA: Diagnosis not present

## 2016-11-10 DIAGNOSIS — Z6841 Body Mass Index (BMI) 40.0 and over, adult: Secondary | ICD-10-CM | POA: Diagnosis not present

## 2016-11-10 DIAGNOSIS — F419 Anxiety disorder, unspecified: Secondary | ICD-10-CM | POA: Diagnosis not present

## 2016-11-10 DIAGNOSIS — I10 Essential (primary) hypertension: Secondary | ICD-10-CM | POA: Diagnosis not present

## 2017-05-14 DIAGNOSIS — D239 Other benign neoplasm of skin, unspecified: Secondary | ICD-10-CM | POA: Diagnosis not present

## 2017-05-14 DIAGNOSIS — Z23 Encounter for immunization: Secondary | ICD-10-CM | POA: Diagnosis not present

## 2017-05-14 DIAGNOSIS — I10 Essential (primary) hypertension: Secondary | ICD-10-CM | POA: Diagnosis not present

## 2017-05-24 DIAGNOSIS — H10413 Chronic giant papillary conjunctivitis, bilateral: Secondary | ICD-10-CM | POA: Diagnosis not present

## 2017-06-29 DIAGNOSIS — D126 Benign neoplasm of colon, unspecified: Secondary | ICD-10-CM | POA: Diagnosis not present

## 2017-06-29 DIAGNOSIS — Z1211 Encounter for screening for malignant neoplasm of colon: Secondary | ICD-10-CM | POA: Diagnosis not present

## 2017-06-29 DIAGNOSIS — K635 Polyp of colon: Secondary | ICD-10-CM | POA: Diagnosis not present

## 2017-07-03 DIAGNOSIS — D126 Benign neoplasm of colon, unspecified: Secondary | ICD-10-CM | POA: Diagnosis not present

## 2017-07-03 DIAGNOSIS — K635 Polyp of colon: Secondary | ICD-10-CM | POA: Diagnosis not present

## 2017-08-08 DIAGNOSIS — D485 Neoplasm of uncertain behavior of skin: Secondary | ICD-10-CM | POA: Diagnosis not present

## 2017-08-08 DIAGNOSIS — I10 Essential (primary) hypertension: Secondary | ICD-10-CM | POA: Diagnosis not present

## 2017-08-08 DIAGNOSIS — D225 Melanocytic nevi of trunk: Secondary | ICD-10-CM | POA: Diagnosis not present

## 2017-08-30 DIAGNOSIS — D485 Neoplasm of uncertain behavior of skin: Secondary | ICD-10-CM | POA: Diagnosis not present

## 2017-11-14 DIAGNOSIS — D485 Neoplasm of uncertain behavior of skin: Secondary | ICD-10-CM | POA: Diagnosis not present

## 2017-11-14 DIAGNOSIS — I10 Essential (primary) hypertension: Secondary | ICD-10-CM | POA: Diagnosis not present

## 2017-11-14 DIAGNOSIS — Z1211 Encounter for screening for malignant neoplasm of colon: Secondary | ICD-10-CM | POA: Diagnosis not present

## 2017-11-14 DIAGNOSIS — Z6841 Body Mass Index (BMI) 40.0 and over, adult: Secondary | ICD-10-CM | POA: Diagnosis not present

## 2018-01-14 IMAGING — CT CT ABD-PELV W/ CM
2 of 6 series · 16 of 46 positions shown, 18 images · IV contrast (Omni 300)
Comparison: None.

CLINICAL DATA: Right lower quadrant pain, nausea.

EXAM:
CT ABDOMEN AND PELVIS WITH CONTRAST
TECHNIQUE: Multidetector CT imaging of the abdomen and pelvis was performed
using the standard protocol following bolus administration of
intravenous contrast.
CONTRAST:  100 83QSRJ-UWW IOPAMIDOL (83QSRJ-UWW) INJECTION 61%

[Series 2: a/p w/ 5mm · axial · 0.93mm/px · z∈[-681,-191]mm · 13 of 113 slices shown, 15 images]
[im 8/113  soft-tissue]
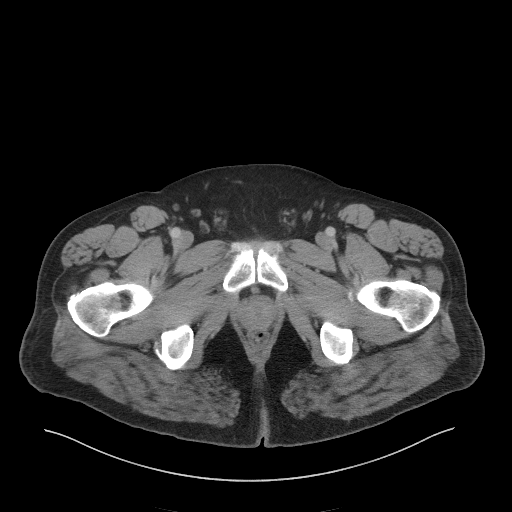
[im 8/113  bone]
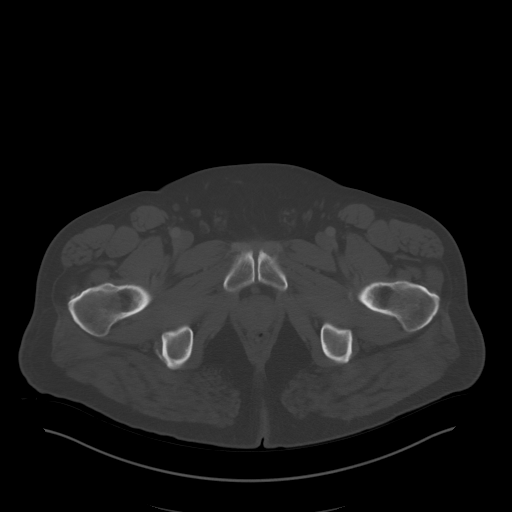
[im 15/113  soft-tissue]
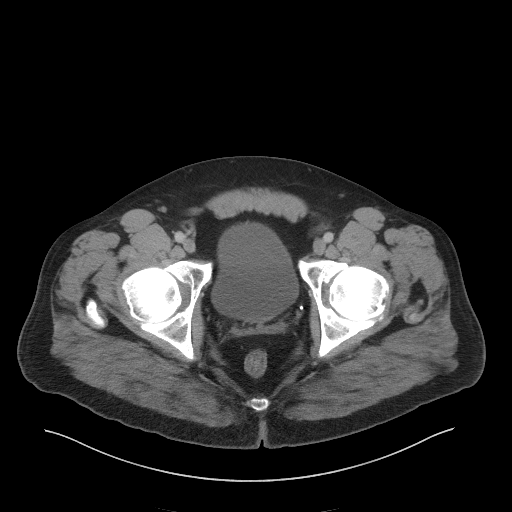
[im 22/113  soft-tissue]
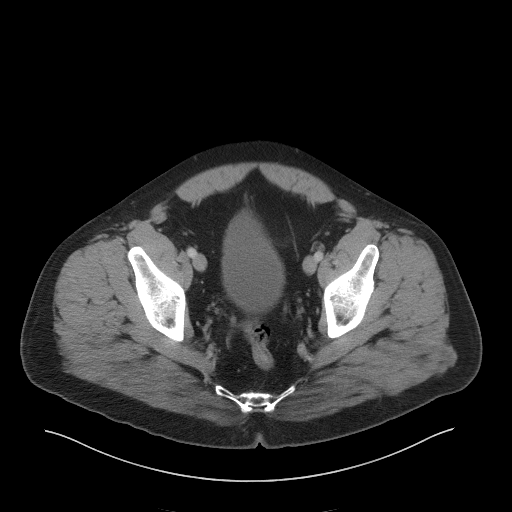
[im 36/113  soft-tissue]
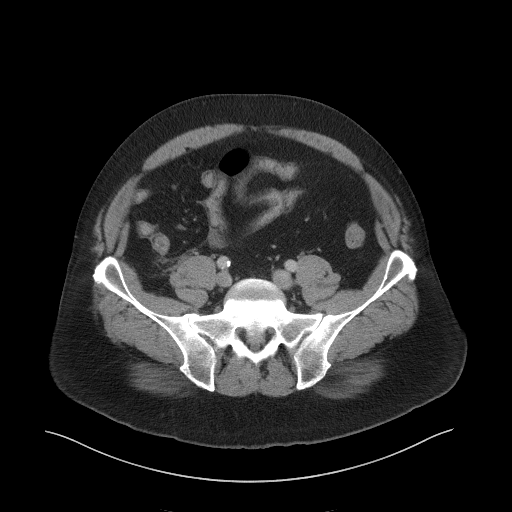
[im 43/113  soft-tissue]
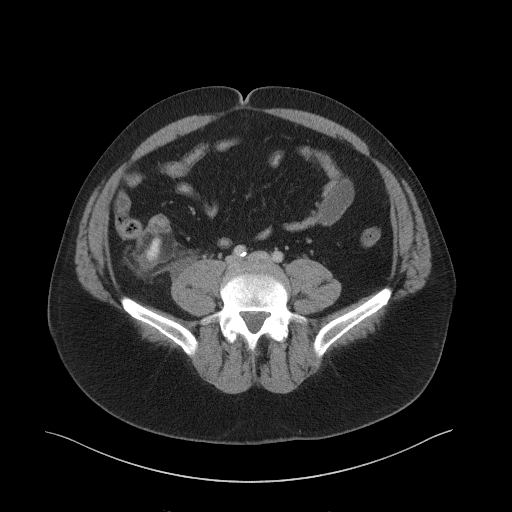
[im 50/113  soft-tissue]
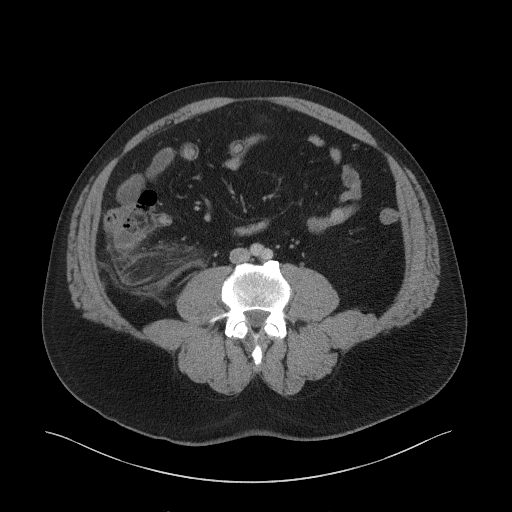
[im 57/113  soft-tissue]
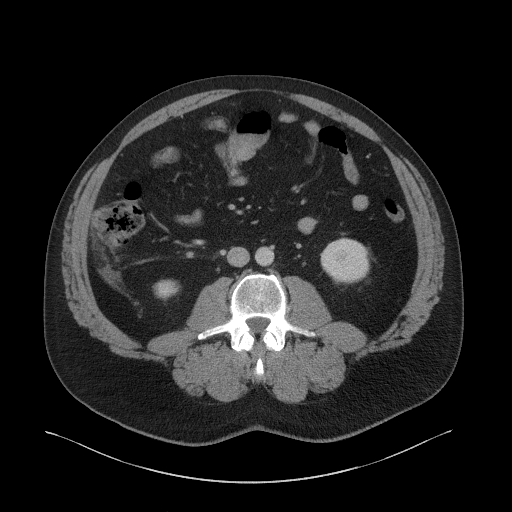
[im 64/113  soft-tissue]
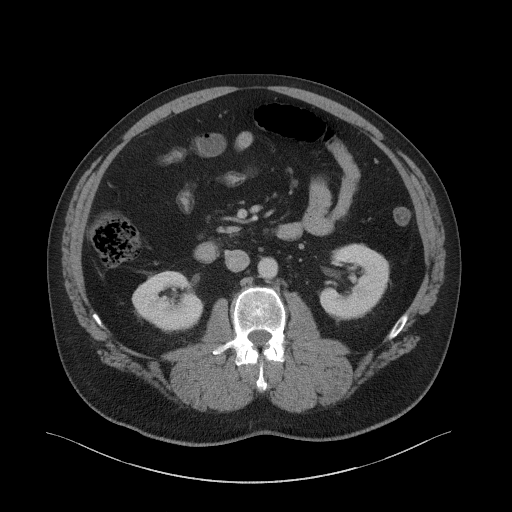
[im 71/113  soft-tissue]
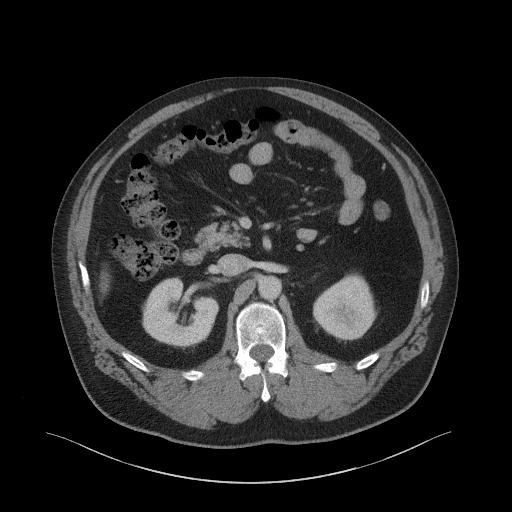
[im 71/113  bone]
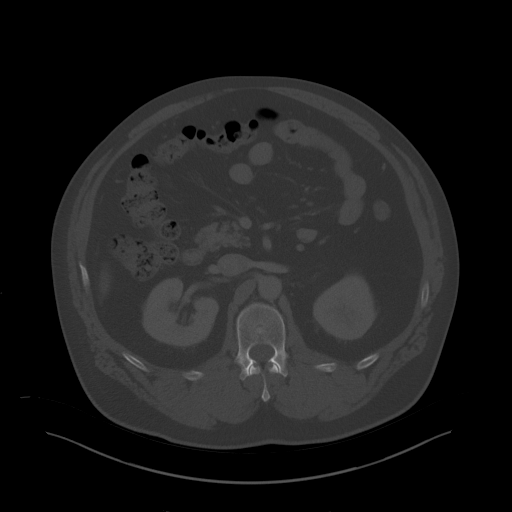
[im 78/113  soft-tissue]
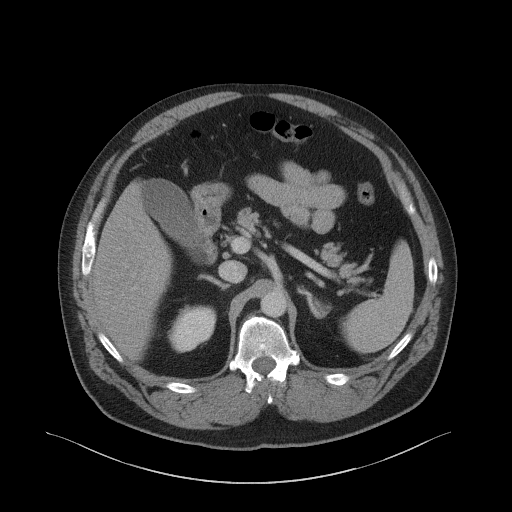
[im 92/113  soft-tissue]
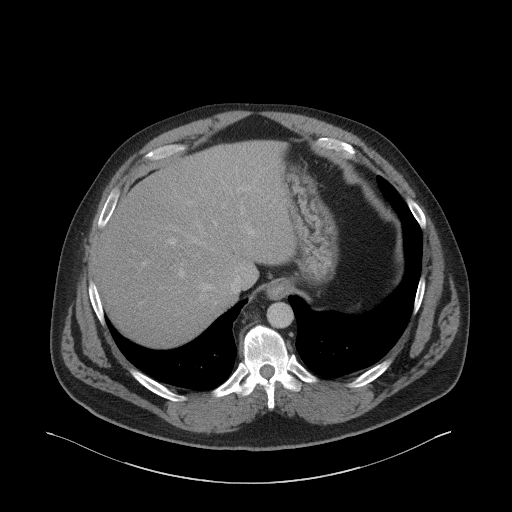
[im 99/113  soft-tissue]
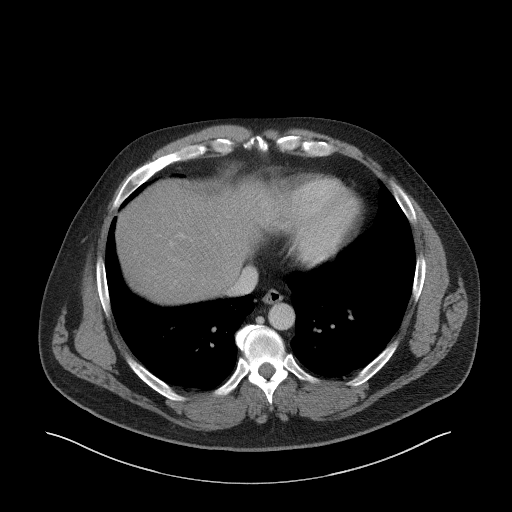
[im 106/113  soft-tissue]
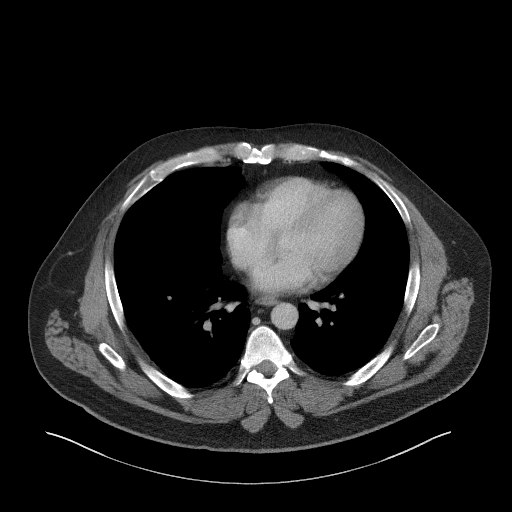

[Series 5: a/p w/ cor · coronal · 0.83mm/px · 3 of 184 slices shown]
[im 62/184  soft-tissue]
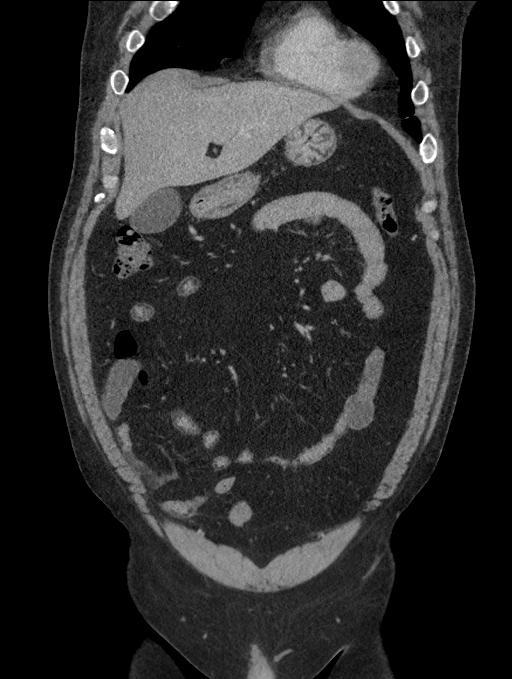
[im 82/184  soft-tissue]
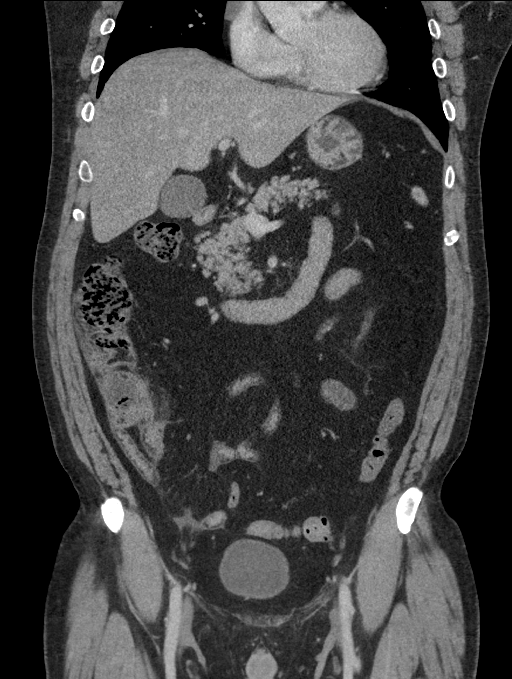
[im 102/184  soft-tissue]
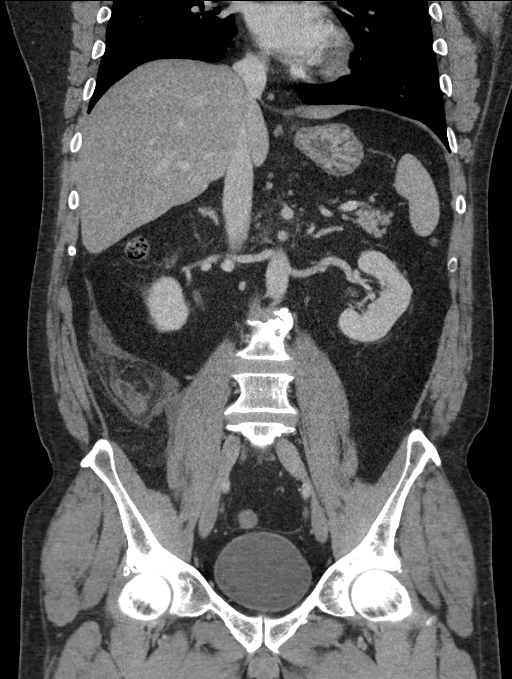

[16 of 46 positions shown; findings below may reference images not displayed]

FINDINGS: Lower chest: Lung bases are clear. No effusions. Heart is normal
size.

Hepatobiliary: Mild diffuse fatty infiltration of the liver. No
focal abnormality or biliary duct dilatation. Gallbladder
unremarkable.

Pancreas: No focal abnormality or ductal dilatation.

Spleen: No focal abnormality.  Normal size.

Adrenals/Urinary Tract: No adrenal abnormality. No focal renal
abnormality. No stones or hydronephrosis. Urinary bladder is
unremarkable.

Stomach/Bowel: There is a large appendicolith at the base of the
appendix. There is a retrocecal appendix which is dilated measuring
19 mm. Extensive surrounding inflammatory change. Findings
compatible with acute appendicitis.

Vascular/Lymphatic: Scattered aortic and iliac calcifications. No
aneurysm. No adenopathy.

Reproductive: No visible focal abnormality.

Other:  Small amount of free fluid in the cul-de-sac.  No free air.

Musculoskeletal: No acute bony abnormality or focal bone lesion.
IMPRESSION: Large appendicolith at the base of the appendix with significantly
dilated appendix and surrounding inflammatory change compatible with
acute appendicitis. Trace free fluid in the cul-de-sac.

These results will be called to the ordering clinician or
representative by the Radiologist Assistant, and communication
documented in the PACS or zVision Dashboard.

## 2018-07-16 DIAGNOSIS — Z23 Encounter for immunization: Secondary | ICD-10-CM | POA: Diagnosis not present

## 2018-07-16 DIAGNOSIS — I10 Essential (primary) hypertension: Secondary | ICD-10-CM | POA: Diagnosis not present

## 2019-08-04 DIAGNOSIS — K635 Polyp of colon: Secondary | ICD-10-CM | POA: Diagnosis not present

## 2019-08-04 DIAGNOSIS — F419 Anxiety disorder, unspecified: Secondary | ICD-10-CM | POA: Diagnosis not present

## 2019-08-04 DIAGNOSIS — I10 Essential (primary) hypertension: Secondary | ICD-10-CM | POA: Diagnosis not present

## 2019-08-04 DIAGNOSIS — Z125 Encounter for screening for malignant neoplasm of prostate: Secondary | ICD-10-CM | POA: Diagnosis not present

## 2019-08-04 DIAGNOSIS — Z008 Encounter for other general examination: Secondary | ICD-10-CM | POA: Diagnosis not present

## 2019-08-04 DIAGNOSIS — D485 Neoplasm of uncertain behavior of skin: Secondary | ICD-10-CM | POA: Diagnosis not present

## 2019-08-04 DIAGNOSIS — Z1211 Encounter for screening for malignant neoplasm of colon: Secondary | ICD-10-CM | POA: Diagnosis not present

## 2020-03-23 DIAGNOSIS — F419 Anxiety disorder, unspecified: Secondary | ICD-10-CM | POA: Diagnosis not present

## 2020-03-23 DIAGNOSIS — I10 Essential (primary) hypertension: Secondary | ICD-10-CM | POA: Diagnosis not present

## 2020-03-23 DIAGNOSIS — Z1389 Encounter for screening for other disorder: Secondary | ICD-10-CM | POA: Diagnosis not present

## 2020-06-17 DIAGNOSIS — Z20822 Contact with and (suspected) exposure to covid-19: Secondary | ICD-10-CM | POA: Diagnosis not present

## 2020-07-28 DIAGNOSIS — I10 Essential (primary) hypertension: Secondary | ICD-10-CM | POA: Diagnosis not present

## 2020-07-28 DIAGNOSIS — Z0000 Encounter for general adult medical examination without abnormal findings: Secondary | ICD-10-CM | POA: Diagnosis not present

## 2023-04-09 DIAGNOSIS — D124 Benign neoplasm of descending colon: Secondary | ICD-10-CM | POA: Diagnosis not present

## 2023-04-09 DIAGNOSIS — Z09 Encounter for follow-up examination after completed treatment for conditions other than malignant neoplasm: Secondary | ICD-10-CM | POA: Diagnosis not present

## 2023-04-09 DIAGNOSIS — K635 Polyp of colon: Secondary | ICD-10-CM | POA: Diagnosis not present

## 2023-04-09 DIAGNOSIS — D128 Benign neoplasm of rectum: Secondary | ICD-10-CM | POA: Diagnosis not present

## 2023-04-09 DIAGNOSIS — Z86010 Personal history of colonic polyps: Secondary | ICD-10-CM | POA: Diagnosis not present

## 2023-12-19 ENCOUNTER — Ambulatory Visit: Payer: Self-pay | Admitting: Surgery

## 2023-12-19 DIAGNOSIS — D171 Benign lipomatous neoplasm of skin and subcutaneous tissue of trunk: Secondary | ICD-10-CM | POA: Diagnosis not present

## 2023-12-19 NOTE — H&P (Signed)
 Subjective    Chief Complaint: Soft Tissue Mass       History of Present Illness: Oscar Scott is a 58 y.o. male who is seen today as an office consultation at the request of Dr. Husain for evaluation of Soft Tissue Mass .     This is a 58 year old male who presents with a 7-year history of a slowly enlarging soft tissue mass just over his left clavicle.  Over the last several months, this has enlarged slightly and is causing significant discomfort.  This discomfort is especially obvious after he has been sleeping on his side.  He has not had any imaging of this area.  This area has never become infected.     Review of Systems: A complete review of systems was obtained from the patient.  I have reviewed this information and discussed as appropriate with the patient.  See HPI as well for other ROS.   Review of Systems  Constitutional: Negative.   HENT:  Positive for tinnitus.   Eyes: Negative.   Respiratory: Negative.    Cardiovascular: Negative.   Gastrointestinal: Negative.   Genitourinary: Negative.   Musculoskeletal: Negative.   Skin: Negative.   Neurological: Negative.   Endo/Heme/Allergies: Negative.   Psychiatric/Behavioral: Negative.          Medical History: Past Medical History      Past Medical History:  Diagnosis Date   Anxiety     Hypertension          Problem List     Patient Active Problem List  Diagnosis   Lipoma of torso        Past Surgical History  History reviewed. No pertinent surgical history.      Allergies  No Known Allergies     Medications Ordered Prior to Encounter        Current Outpatient Medications on File Prior to Visit  Medication Sig Dispense Refill   ALPRAZolam  (XANAX ) 0.5 MG tablet 1 TABLET AS NEEDED ORALLY AT BEDTIME AS NEEDED 30 DAYS       hydroCHLOROthiazide  (HYDRODIURIL ) 25 MG tablet Take 25 mg by mouth once daily       lisinopriL  (ZESTRIL ) 20 MG tablet Take 20 mg by mouth once daily       metoprolol   SUCCinate (TOPROL -XL) 100 MG XL tablet Take 100 mg by mouth once daily       tadalafiL (CIALIS) 10 MG tablet TAKE 1 TABLET BY MOUTH EVERY DAY AS NEEDED**NOT COVERED**       ascorbic acid, vitamin C, (VITAMIN C) 500 MG tablet Take 500 mg by mouth once daily       cetirizine (ZYRTEC) 10 MG tablet Take 10 mg by mouth once daily       cholecalciferol (VITAMIN D3) 2,000 unit capsule Take 2,000 Units by mouth once daily       multivitamin tablet Take 1 tablet by mouth once daily       omega 3-dha-epa-fish oil (FISH OIL) 1,000 (120-180) mg Cap Take by mouth       zinc acetate 50 mg (zinc) Cap Take by mouth        No current facility-administered medications on file prior to visit.        Family History  History reviewed. No pertinent family history.      Tobacco Use History  Social History        Tobacco Use  Smoking Status Former   Current packs/day: 0.00   Types: Cigarettes   Quit  date: 2007   Years since quitting: 18.3  Smokeless Tobacco Never        Social History  Social History         Socioeconomic History   Marital status: Divorced  Tobacco Use   Smoking status: Former      Current packs/day: 0.00      Types: Cigarettes      Quit date: 2007      Years since quitting: 18.3   Smokeless tobacco: Never  Vaping Use   Vaping status: Never Used  Substance and Sexual Activity   Alcohol use: Yes   Drug use: Never    Social Drivers of Health        Housing Stability: Unknown (12/19/2023)    Housing Stability Vital Sign     Homeless in the Last Year: No        Objective:          Vitals:    12/19/23 1408 12/19/23 1409  Pulse: 83    Temp: 36.8 C (98.2 F)    SpO2: 97%    Weight: (!) 111.6 kg (246 lb)    Height: 188 cm (6\' 2" )    PainSc:   0-No pain    Body mass index is 31.58 kg/m.   Physical Exam    Constitutional:  WDWN in NAD, conversant, no obvious deformities; lying in bed comfortably Eyes:  Pupils equal, round; sclera anicteric; moist  conjunctiva; no lid lag HENT:  Oral mucosa moist; good dentition  Neck:  No masses palpated, trachea midline; no thyromegaly Lungs:  CTA bilaterally; normal respiratory effort Left upper chest just over the collarbone there is a oval-shaped 7 cm diameter protruding soft tissue mass.  This is smooth with no overlying skin changes.  The mass is mobile and does not appear to be fixed to any surrounding structures.  There may be a smaller 1 cm lipoma just lateral to this larger soft tissue mass. CV:  Regular rate and rhythm; no murmurs; extremities well-perfused with no edema Abd:  +bowel sounds, soft, non-tender, no palpable organomegaly; no palpable hernias Musc: Normal gait; no apparent clubbing or cyanosis in extremities Lymphatic:  No palpable cervical or axillary lymphadenopathy Skin:  Warm, dry; no sign of jaundice Psychiatric - alert and oriented x 4; calm mood and affect     Assessment and Plan:  Diagnoses and all orders for this visit:   Lipoma of torso       Recommend excision of subcutaneous lipoma of the left upper chest.The surgical procedure has been discussed with the patient.  Potential risks, benefits, alternative treatments, and expected outcomes have been explained.  All of the patient's questions at this time have been answered.  The likelihood of reaching the patient's treatment goal is good.  The patient understands the proposed surgical procedure and wishes to proceed.       Madalaine Portier Jannelle Memory, MD  12/19/2023 2:29 PM

## 2024-01-22 ENCOUNTER — Other Ambulatory Visit: Payer: Self-pay | Admitting: Surgery

## 2024-01-22 DIAGNOSIS — D171 Benign lipomatous neoplasm of skin and subcutaneous tissue of trunk: Secondary | ICD-10-CM | POA: Diagnosis not present

## 2024-01-23 LAB — SURGICAL PATHOLOGY

## 2024-06-16 DIAGNOSIS — G4733 Obstructive sleep apnea (adult) (pediatric): Secondary | ICD-10-CM | POA: Diagnosis not present

## 2024-06-16 DIAGNOSIS — R7303 Prediabetes: Secondary | ICD-10-CM | POA: Diagnosis not present

## 2024-06-16 DIAGNOSIS — J309 Allergic rhinitis, unspecified: Secondary | ICD-10-CM | POA: Diagnosis not present

## 2024-06-16 DIAGNOSIS — F419 Anxiety disorder, unspecified: Secondary | ICD-10-CM | POA: Diagnosis not present

## 2024-06-16 DIAGNOSIS — H9313 Tinnitus, bilateral: Secondary | ICD-10-CM | POA: Diagnosis not present

## 2024-06-16 DIAGNOSIS — I10 Essential (primary) hypertension: Secondary | ICD-10-CM | POA: Diagnosis not present
